# Patient Record
Sex: Female | Born: 1970
Health system: Southern US, Community
[De-identification: ages and names within clinical notes are randomized; demographics above are authoritative.]

## PROBLEM LIST (undated history)

## (undated) DIAGNOSIS — D649 Anemia, unspecified: Secondary | ICD-10-CM

## (undated) DIAGNOSIS — N200 Calculus of kidney: Secondary | ICD-10-CM

## (undated) DIAGNOSIS — M549 Dorsalgia, unspecified: Secondary | ICD-10-CM

## (undated) DIAGNOSIS — K921 Melena: Secondary | ICD-10-CM

## (undated) DIAGNOSIS — F419 Anxiety disorder, unspecified: Secondary | ICD-10-CM

## (undated) DIAGNOSIS — R55 Syncope and collapse: Secondary | ICD-10-CM

## (undated) DIAGNOSIS — N289 Disorder of kidney and ureter, unspecified: Secondary | ICD-10-CM

## (undated) DIAGNOSIS — F329 Major depressive disorder, single episode, unspecified: Secondary | ICD-10-CM

## (undated) DIAGNOSIS — K219 Gastro-esophageal reflux disease without esophagitis: Secondary | ICD-10-CM

## (undated) DIAGNOSIS — Z8619 Personal history of other infectious and parasitic diseases: Secondary | ICD-10-CM

## (undated) DIAGNOSIS — G8929 Other chronic pain: Secondary | ICD-10-CM

## (undated) DIAGNOSIS — R51 Headache: Secondary | ICD-10-CM

## (undated) DIAGNOSIS — L570 Actinic keratosis: Secondary | ICD-10-CM

## (undated) DIAGNOSIS — F32A Depression, unspecified: Secondary | ICD-10-CM

## (undated) HISTORY — PX: VARICOSE VEIN SURGERY: SHX832

## (undated) HISTORY — DX: Actinic keratosis: L57.0

## (undated) HISTORY — PX: DILATION AND CURETTAGE OF UTERUS: SHX78

## (undated) HISTORY — DX: Anemia, unspecified: D64.9

## (undated) HISTORY — DX: Depression, unspecified: F32.A

## (undated) HISTORY — DX: Syncope and collapse: R55

## (undated) HISTORY — DX: Calculus of kidney: N20.0

## (undated) HISTORY — DX: Personal history of other infectious and parasitic diseases: Z86.19

## (undated) HISTORY — DX: Melena: K92.1

## (undated) HISTORY — PX: ABDOMINAL HYSTERECTOMY: SHX81

## (undated) HISTORY — PX: OTHER SURGICAL HISTORY: SHX169

## (undated) HISTORY — PX: BACK SURGERY: SHX140

## (undated) HISTORY — DX: Headache: R51

## (undated) HISTORY — DX: Major depressive disorder, single episode, unspecified: F32.9

## (undated) HISTORY — PX: DIAGNOSTIC LAPAROSCOPY: SUR761

## (undated) HISTORY — PX: LUMBAR FUSION: SHX111

---

## 1997-10-25 ENCOUNTER — Ambulatory Visit (HOSPITAL_COMMUNITY): Admission: RE | Admit: 1997-10-25 | Discharge: 1997-10-25 | Payer: Self-pay | Admitting: Neurosurgery

## 1999-06-02 ENCOUNTER — Other Ambulatory Visit: Admission: RE | Admit: 1999-06-02 | Discharge: 1999-06-02 | Payer: Self-pay | Admitting: Obstetrics and Gynecology

## 1999-12-20 ENCOUNTER — Inpatient Hospital Stay (HOSPITAL_COMMUNITY): Admission: AD | Admit: 1999-12-20 | Discharge: 1999-12-23 | Payer: Self-pay | Admitting: Obstetrics and Gynecology

## 1999-12-27 ENCOUNTER — Observation Stay (HOSPITAL_COMMUNITY): Admission: AD | Admit: 1999-12-27 | Discharge: 1999-12-28 | Payer: Self-pay | Admitting: *Deleted

## 1999-12-30 ENCOUNTER — Encounter: Admission: RE | Admit: 1999-12-30 | Discharge: 2000-01-14 | Payer: Self-pay | Admitting: Obstetrics and Gynecology

## 2000-02-06 ENCOUNTER — Other Ambulatory Visit: Admission: RE | Admit: 2000-02-06 | Discharge: 2000-02-06 | Payer: Self-pay | Admitting: Obstetrics and Gynecology

## 2001-06-23 ENCOUNTER — Other Ambulatory Visit: Admission: RE | Admit: 2001-06-23 | Discharge: 2001-06-23 | Payer: Self-pay | Admitting: Obstetrics and Gynecology

## 2001-10-03 ENCOUNTER — Emergency Department (HOSPITAL_COMMUNITY): Admission: EM | Admit: 2001-10-03 | Discharge: 2001-10-03 | Payer: Self-pay | Admitting: *Deleted

## 2002-02-03 ENCOUNTER — Ambulatory Visit (HOSPITAL_COMMUNITY): Admission: RE | Admit: 2002-02-03 | Discharge: 2002-02-03 | Payer: Self-pay | Admitting: Obstetrics and Gynecology

## 2002-02-15 ENCOUNTER — Emergency Department (HOSPITAL_COMMUNITY): Admission: EM | Admit: 2002-02-15 | Discharge: 2002-02-16 | Payer: Self-pay | Admitting: Podiatry

## 2002-02-16 ENCOUNTER — Encounter: Payer: Self-pay | Admitting: *Deleted

## 2002-06-26 ENCOUNTER — Other Ambulatory Visit: Admission: RE | Admit: 2002-06-26 | Discharge: 2002-06-26 | Payer: Self-pay | Admitting: Obstetrics and Gynecology

## 2003-08-15 ENCOUNTER — Other Ambulatory Visit: Admission: RE | Admit: 2003-08-15 | Discharge: 2003-08-15 | Payer: Self-pay | Admitting: Obstetrics and Gynecology

## 2006-02-17 LAB — HIV ANTIBODY (ROUTINE TESTING W REFLEX): HIV: NONREACTIVE

## 2007-05-26 HISTORY — PX: KNEE SURGERY: SHX244

## 2007-05-26 HISTORY — PX: PATELLA REALIGNMENT: SHX2179

## 2007-08-08 ENCOUNTER — Encounter: Payer: Self-pay | Admitting: Physical Medicine & Rehabilitation

## 2007-08-24 ENCOUNTER — Encounter: Payer: Self-pay | Admitting: Physical Medicine & Rehabilitation

## 2007-09-23 ENCOUNTER — Encounter: Payer: Self-pay | Admitting: Physical Medicine & Rehabilitation

## 2007-10-24 ENCOUNTER — Encounter: Payer: Self-pay | Admitting: Physical Medicine & Rehabilitation

## 2009-04-03 ENCOUNTER — Emergency Department: Payer: Self-pay | Admitting: Emergency Medicine

## 2010-07-17 ENCOUNTER — Ambulatory Visit: Payer: Self-pay | Admitting: Family Medicine

## 2010-07-23 ENCOUNTER — Ambulatory Visit: Payer: Self-pay | Admitting: Family Medicine

## 2010-09-17 ENCOUNTER — Ambulatory Visit: Payer: Self-pay | Admitting: Family Medicine

## 2010-10-10 ENCOUNTER — Ambulatory Visit (INDEPENDENT_AMBULATORY_CARE_PROVIDER_SITE_OTHER): Payer: 59 | Admitting: Family Medicine

## 2010-10-10 ENCOUNTER — Encounter: Payer: Self-pay | Admitting: Family Medicine

## 2010-10-10 DIAGNOSIS — R079 Chest pain, unspecified: Secondary | ICD-10-CM

## 2010-10-10 DIAGNOSIS — G43909 Migraine, unspecified, not intractable, without status migrainosus: Secondary | ICD-10-CM

## 2010-10-10 DIAGNOSIS — F329 Major depressive disorder, single episode, unspecified: Secondary | ICD-10-CM

## 2010-10-10 DIAGNOSIS — K625 Hemorrhage of anus and rectum: Secondary | ICD-10-CM | POA: Insufficient documentation

## 2010-10-10 DIAGNOSIS — N809 Endometriosis, unspecified: Secondary | ICD-10-CM | POA: Insufficient documentation

## 2010-10-10 MED ORDER — OMEPRAZOLE 20 MG PO CPDR: 20.0000 mg | DELAYED_RELEASE_CAPSULE | Freq: Every day | ORAL | Status: DC

## 2010-10-10 MED ORDER — IBUPROFEN 800 MG PO TABS: ORAL_TABLET | ORAL | Status: DC

## 2010-10-10 NOTE — Progress Notes (Signed)
Subjective:    Patient ID: [name] Orr, female    DOB: 18-Apr-1971, 40 y.o.   MRN: [id number]  HPI Here to get est as new patient  Gyn-- Dr Rosemary Holms    Has complex med hx   Anemia - was hospitalized in teens -- for severe anemia  This followed a very bad period   Endometriosis- sees Dr Rosemary Holms -- extremely painful and heavy peroid  Was on OC -- (depo provera) - up until a year ago  Had laparoscopy - then preg and then depo  Now insurance- will likely go back on it  Concerned about wt gain with that -- tried "diet 2 mo " with a little exercise  Severe pain from endometriosis not just during period -- takes percocet from Dr Rosemary Holms for that =- infrequently  She had retained placenta after birth of her baby Almost died   Is single  Has 35 year old girl - 5th grade - will go to middle school    Depression  This happened after leaving boyfriend of 58 years (father of her daughter)  Overall better Saw psychiatrist briefly - and xanax  Better now   Migraine - was treated a long time ago j(? Dr Meryl Crutch) at [facility] center Still gets some occasionally - just deals with it   Father -- alcoholic and many health problems Parents live close by  Whole family comes here     Having trouble with cp-/ sob  Has had chest pains for a long time- never made an issue of it  Started in march Happened after she had a cold she could not get over -- went to urgent care and dx with costochondritis  Continues to have cp and sob  Not related to stress or exertion or eating  Can be seconds to 15 minutes  Center of her chest - a pressure sensation like someone standing on her  Gets sob with it too  Will sometimes get a headache with it -- because of getting worked up  Some nausea - no sweating no vomiting  Has not had EKG   Saw Dr Daleen Squibb remotely -- when she had palpitations with anxiety -- had echo / holter  6-7 years ago  All was normal   Not a caffiene drinker     Rectal bleeding   For the last year --has had bleeding every time she has a bm  Will drip out of her - bright red -  Sometimes has rectal pain  Did have hemorroids when she had her baby  Feels like she might have some swelling  Usually has bm every 3 days  No abd pain outside of her peroid -- very painful bms during her peroid   Non smoker  Non drinker   Past Medical History  Diagnosis Date  . Anemia   . Depression   . History of chicken pox   . Blood in stool     bright red each time has BM for atleast 6 mths  . Headache   . Fainting spell   . Migraine   . Endometriosis     Past Surgical History  Procedure Date  . Back surgery [phone]    Dr Trey Sailors  . Laproscopy   . Knee surgery 2009    lateral realignment to lt knee    History   Social History  . Marital Status: Single    Spouse Name: N/A    Number of Children: N/A  . Years of  Education: N/A   Occupational History  . Not on file.   Social History Main Topics  . Smoking status: Never Smoker   . Smokeless tobacco: Not on file  . Alcohol Use: Yes     occasionally  . Drug Use: No  . Sexually Active: Not on file   Other Topics Concern  . Not on file   Social History Narrative  . No narrative on file    Family History  Problem Relation Age of Onset  . Hyperlipidemia Mother   . Hypertension Mother   . Alcohol abuse Father   . Heart disease Father   . Stroke Father   . Kidney disease Father     Allergies  Allergen Reactions  . Penicillins Hives   .         Review of Systems Review of Systems  Constitutional: Negative for fever, appetite change, fatigue and unexpected weight change.  Eyes: Negative for pain and visual disturbance.  Respiratory: Negative for cough and shortness of breath.   Cardiovascular: Negative for edema/ leg pain, pos for chest pain .   Gastrointestinal: Negative , diarrhea and constipation. pos for nausea occas/ no vomiting , pos for blood in stool Genitourinary: Negative for  urgency and frequency.  Skin: Negative for pallor.  Neurological: Negative for weakness, light-headedness, numbness and headaches.  Hematological: Negative for adenopathy. Does not bruise/bleed easily.  Psychiatric/Behavioral: Negative for dysphoric mood. The patient is not nervous/anxious.          Objective:   Physical Exam  Constitutional: She appears well-developed and well-nourished. No distress.       overwt and well appearing   HENT:  Head: Normocephalic and atraumatic.  Right Ear: External ear normal.  Left Ear: External ear normal.  Mouth/Throat: Oropharynx is clear and moist.  Eyes: Conjunctivae and EOM are normal. Pupils are equal, round, and reactive to light.       No conj pallor  Neck: Normal range of motion. Neck supple. No JVD present. Carotid bruit is not present. No thyromegaly present.  Cardiovascular: Normal rate, regular rhythm and normal heart sounds.   Pulmonary/Chest: Effort normal and breath sounds normal. No respiratory distress. She has no wheezes. She exhibits no tenderness.  Abdominal: Soft. Normal appearance and bowel sounds are normal. She exhibits no abdominal bruit, no pulsatile midline mass and no mass. There is no hepatosplenomegaly. There is tenderness in the suprapubic area, left upper quadrant and left lower quadrant. There is negative Murphy's sign.  Musculoskeletal: She exhibits no edema and no tenderness.  Lymphadenopathy:    She has no cervical adenopathy.  Neurological: She is alert. She has normal reflexes. No cranial nerve deficit. Coordination normal.  Skin: Skin is warm and dry. No rash noted. No erythema. No pallor.  Psychiatric:       Seems mildly anx but pleasant  Good eye contact and comm skills           Assessment & Plan:

## 2010-10-10 NOTE — Patient Instructions (Signed)
Start omeprazole (generic prilosec)  20 mg each am  This is to see if chest pain is coming from reflux  Also watch out for spicy foods and acidic beverages  Also anti inflammatories -- like the ibuprofen  If you suddenly get worse -- go to ER  Follow up with me in about 2 weeks to continue disc the GI issues

## 2010-10-10 NOTE — Assessment & Plan Note (Signed)
Will disc this at f/u in 2 weeks  Cannot tolerate exam today in light of menses

## 2010-10-10 NOTE — Assessment & Plan Note (Signed)
This is atypical/ non exertional gerd or esoph spasm is in the differential  - esp given regular use of nsaids for endometriosis  Disc minimizing use of this Prior cardiol w/ u is re assuring - as is EKG today-- rate of 69 with sinus rhythm with occas PAC and no acute changes Trial of omeprazole and minimize nsaid (disc alt tx of endomet with gyn) F/u 2 wk If worse- stressed imp of going to ER

## 2010-10-12 DIAGNOSIS — G43509 Persistent migraine aura without cerebral infarction, not intractable, without status migrainosus: Secondary | ICD-10-CM | POA: Insufficient documentation

## 2010-10-12 DIAGNOSIS — F329 Major depressive disorder, single episode, unspecified: Secondary | ICD-10-CM | POA: Insufficient documentation

## 2010-10-12 NOTE — Assessment & Plan Note (Signed)
Continue f/u with Gyn Suspect she will need to be on OC to  Calm this down Per pt -hysterectomy is not out of the question

## 2010-10-23 ENCOUNTER — Other Ambulatory Visit: Payer: Self-pay | Admitting: Obstetrics and Gynecology

## 2010-10-23 LAB — HM PAP SMEAR: HM PAP: NEGATIVE

## 2010-10-29 ENCOUNTER — Ambulatory Visit: Payer: 59 | Admitting: Family Medicine

## 2010-12-13 ENCOUNTER — Emergency Department: Payer: Self-pay | Admitting: Emergency Medicine

## 2011-02-18 ENCOUNTER — Other Ambulatory Visit: Payer: Self-pay | Admitting: Obstetrics and Gynecology

## 2011-02-24 NOTE — Patient Instructions (Addendum)
   Your procedure is scheduled on:  Thursday, Oct. 11, 2012  Enter through the [employer] [facility] at: 6:00am Pick up the phone at the desk and dial [phone] and inform us of your arrival  Please call this number if you have any problems the morning of surgery: [phone]  Remember: Do not eat food after midnight  Wednesday, Oct. 10th Do not drink clear liquids after:  Wednesday, Oct. 10th Take these medicines the morning of surgery with a SIP OF WATER:  PRILOSEC  Do not wear jewelry, make-up, or FINGER nail polish Do not wear lotions, powders, or perfumes.  You may wear deodorant. Do not shave 48 hours prior to surgery. Do not bring valuables to the hospital. Leave suitcase in the car. After Surgery it may be brought to your room. For patients being admitted to the hospital, checkout time is 11:00am the day of discharge.  Patients discharged on the day of surgery will not be allowed to drive home.   Name and phone number of your driver:  mother Sullivan Blasing [phone]    Remember to use your hibiclens as instructed.Please shower with 1/2 bottle the evening before your surgery and the other 1/2 bottle the morning of surgery.

## 2011-02-26 ENCOUNTER — Encounter (HOSPITAL_COMMUNITY)
Admission: RE | Admit: 2011-02-26 | Discharge: 2011-02-26 | Disposition: A | Discharge status: Home / Self Care | Payer: 59 | Source: Ambulatory Visit | Attending: Obstetrics and Gynecology | Admitting: Obstetrics and Gynecology

## 2011-02-26 ENCOUNTER — Encounter (HOSPITAL_COMMUNITY): Payer: Self-pay

## 2011-02-26 HISTORY — DX: Anxiety disorder, unspecified: F41.9

## 2011-02-26 HISTORY — DX: Other chronic pain: G89.29

## 2011-02-26 HISTORY — DX: Dorsalgia, unspecified: M54.9

## 2011-02-26 HISTORY — DX: Gastro-esophageal reflux disease without esophagitis: K21.9

## 2011-02-26 LAB — CBC
Hemoglobin: 12.5 g/dL (ref 12.0–15.0)
MCH: 29.8 pg (ref 26.0–34.0)
MCHC: 33.7 g/dL (ref 30.0–36.0)
RDW: 12.5 % (ref 11.5–15.5)

## 2011-02-26 LAB — SURGICAL PCR SCREEN: MRSA, PCR: NEGATIVE

## 2011-02-26 NOTE — Pre-Procedure Instructions (Signed)
Ok for patient to see anesthesia DOS.  Patient instructed to take prilosec morning of surgery with small sip of water.

## 2011-03-05 ENCOUNTER — Ambulatory Visit (HOSPITAL_COMMUNITY): Payer: 59 | Admitting: Anesthesiology

## 2011-03-05 ENCOUNTER — Encounter (HOSPITAL_COMMUNITY): Payer: Self-pay | Admitting: Obstetrics and Gynecology

## 2011-03-05 ENCOUNTER — Encounter (HOSPITAL_COMMUNITY)
Admission: RE | Disposition: A | Discharge status: Home / Self Care | Payer: Self-pay | Source: Ambulatory Visit | Attending: Obstetrics and Gynecology

## 2011-03-05 ENCOUNTER — Encounter (HOSPITAL_COMMUNITY): Payer: Self-pay | Admitting: Anesthesiology

## 2011-03-05 ENCOUNTER — Other Ambulatory Visit: Payer: Self-pay | Admitting: Obstetrics and Gynecology

## 2011-03-05 ENCOUNTER — Ambulatory Visit (HOSPITAL_COMMUNITY)
Admission: RE | Admit: 2011-03-05 | Discharge: 2011-03-06 | Disposition: A | Discharge status: Home / Self Care | Payer: 59 | Source: Ambulatory Visit | Attending: Obstetrics and Gynecology | Admitting: Obstetrics and Gynecology

## 2011-03-05 DIAGNOSIS — N803 Endometriosis of pelvic peritoneum, unspecified: Secondary | ICD-10-CM | POA: Insufficient documentation

## 2011-03-05 DIAGNOSIS — N949 Unspecified condition associated with female genital organs and menstrual cycle: Secondary | ICD-10-CM | POA: Insufficient documentation

## 2011-03-05 DIAGNOSIS — Z01812 Encounter for preprocedural laboratory examination: Secondary | ICD-10-CM | POA: Insufficient documentation

## 2011-03-05 DIAGNOSIS — N946 Dysmenorrhea, unspecified: Secondary | ICD-10-CM | POA: Insufficient documentation

## 2011-03-05 DIAGNOSIS — Z01818 Encounter for other preprocedural examination: Secondary | ICD-10-CM | POA: Insufficient documentation

## 2011-03-05 SURGERY — ROBOTIC ASSISTED TOTAL HYSTERECTOMY
Anesthesia: General | Site: Uterus | Wound class: Clean Contaminated

## 2011-03-05 MED ORDER — BUPIVACAINE HCL (PF) 0.25 % IJ SOLN
INTRAMUSCULAR | Status: DC | PRN
  Administered 2011-03-05: 12 mL

## 2011-03-05 MED ORDER — GLYCOPYRROLATE 0.2 MG/ML IJ SOLN
INTRAMUSCULAR | Status: DC | PRN
  Administered 2011-03-05: .6 mg via INTRAVENOUS

## 2011-03-05 MED ORDER — ALUM & MAG HYDROXIDE-SIMETH 200-200-20 MG/5ML PO SUSP: 30.0000 mL | ORAL | Status: DC | PRN

## 2011-03-05 MED ORDER — ACETAMINOPHEN 10 MG/ML IV SOLN
1000.0000 mg | Freq: Four times a day (QID) | INTRAVENOUS | Status: DC
  Filled 2011-03-05 (×4): qty 100

## 2011-03-05 MED ORDER — HYDROMORPHONE 0.3 MG/ML IV SOLN
INTRAVENOUS | Status: AC
  Administered 2011-03-05: 11 mg via INTRAVENOUS
  Administered 2011-03-05: 12:00:00 via INTRAVENOUS

## 2011-03-05 MED ORDER — MAGNESIUM CITRATE PO SOLN
296.0000 mL | Freq: Every day | ORAL | Status: DC | PRN
  Filled 2011-03-05: qty 296

## 2011-03-05 MED ORDER — CIPROFLOXACIN IN D5W 400 MG/200ML IV SOLN
400.0000 mg | Freq: Two times a day (BID) | INTRAVENOUS | Status: AC
  Administered 2011-03-05: 400 mg via INTRAVENOUS
  Filled 2011-03-05: qty 200

## 2011-03-05 MED ORDER — CLINDAMYCIN PHOSPHATE 900 MG/50ML IV SOLN
900.0000 mg | Freq: Four times a day (QID) | INTRAVENOUS | Status: DC
  Administered 2011-03-05: 900 mg via INTRAVENOUS
  Filled 2011-03-05 (×6): qty 50

## 2011-03-05 MED ORDER — ROCURONIUM BROMIDE 100 MG/10ML IV SOLN
INTRAVENOUS | Status: DC | PRN
  Administered 2011-03-05: 50 mg via INTRAVENOUS
  Administered 2011-03-05: 10 mg via INTRAVENOUS

## 2011-03-05 MED ORDER — DIPHENHYDRAMINE HCL 12.5 MG/5ML PO ELIX
12.5000 mg | ORAL_SOLUTION | Freq: Four times a day (QID) | ORAL | Status: DC | PRN
  Filled 2011-03-05: qty 5

## 2011-03-05 MED ORDER — FENTANYL CITRATE 0.05 MG/ML IJ SOLN
INTRAMUSCULAR | Status: AC
  Filled 2011-03-05: qty 2

## 2011-03-05 MED ORDER — FENTANYL CITRATE 0.05 MG/ML IJ SOLN
INTRAMUSCULAR | Status: AC
  Administered 2011-03-05: 50 ug via INTRAVENOUS
  Filled 2011-03-05: qty 2

## 2011-03-05 MED ORDER — SIMETHICONE 80 MG PO CHEW: 80.0000 mg | CHEWABLE_TABLET | Freq: Four times a day (QID) | ORAL | Status: DC | PRN

## 2011-03-05 MED ORDER — NEOSTIGMINE METHYLSULFATE 1 MG/ML IJ SOLN
INTRAMUSCULAR | Status: AC
  Filled 2011-03-05: qty 10

## 2011-03-05 MED ORDER — CEFAZOLIN SODIUM 1-5 GM-% IV SOLN
INTRAVENOUS | Status: AC
  Filled 2011-03-05: qty 50

## 2011-03-05 MED ORDER — BISACODYL 5 MG PO TBEC
5.0000 mg | DELAYED_RELEASE_TABLET | Freq: Every day | ORAL | Status: DC | PRN
  Filled 2011-03-05: qty 1

## 2011-03-05 MED ORDER — ONDANSETRON HCL 4 MG/2ML IJ SOLN
INTRAMUSCULAR | Status: DC | PRN
  Administered 2011-03-05: 4 mg via INTRAVENOUS

## 2011-03-05 MED ORDER — LIDOCAINE HCL (CARDIAC) 20 MG/ML IV SOLN
INTRAVENOUS | Status: DC | PRN
  Administered 2011-03-05: 40 mg via INTRAVENOUS

## 2011-03-05 MED ORDER — SENNOSIDES-DOCUSATE SODIUM 8.6-50 MG PO TABS: 2.0000 | ORAL_TABLET | Freq: Every day | ORAL | Status: DC | PRN

## 2011-03-05 MED ORDER — PROPOFOL 10 MG/ML IV EMUL
INTRAVENOUS | Status: AC
  Filled 2011-03-05: qty 40

## 2011-03-05 MED ORDER — DIPHENHYDRAMINE HCL 50 MG/ML IJ SOLN
12.5000 mg | Freq: Four times a day (QID) | INTRAMUSCULAR | Status: DC | PRN
  Administered 2011-03-05 – 2011-03-06 (×3): 12.5 mg via INTRAVENOUS
  Filled 2011-03-05 (×2): qty 1

## 2011-03-05 MED ORDER — LACTATED RINGERS IV SOLN
INTRAVENOUS | Status: DC
  Administered 2011-03-05 (×2): via INTRAVENOUS

## 2011-03-05 MED ORDER — FENTANYL CITRATE 0.05 MG/ML IJ SOLN
25.0000 ug | INTRAMUSCULAR | Status: DC | PRN
  Administered 2011-03-05 (×3): 50 ug via INTRAVENOUS

## 2011-03-05 MED ORDER — ROCURONIUM BROMIDE 50 MG/5ML IV SOLN
INTRAVENOUS | Status: AC
  Filled 2011-03-05: qty 1

## 2011-03-05 MED ORDER — SUFENTANIL CITRATE 50 MCG/ML IV SOLN
INTRAVENOUS | Status: AC
  Filled 2011-03-05: qty 1

## 2011-03-05 MED ORDER — HYDROMORPHONE 0.3 MG/ML IV SOLN
INTRAVENOUS | Status: AC
  Filled 2011-03-05: qty 25

## 2011-03-05 MED ORDER — DEXAMETHASONE SODIUM PHOSPHATE 10 MG/ML IJ SOLN
INTRAMUSCULAR | Status: DC | PRN
  Administered 2011-03-05: 10 mg via INTRAVENOUS

## 2011-03-05 MED ORDER — KETOROLAC TROMETHAMINE 30 MG/ML IJ SOLN
30.0000 mg | Freq: Four times a day (QID) | INTRAMUSCULAR | Status: DC | PRN
  Administered 2011-03-05 (×2): 30 mg via INTRAVENOUS
  Filled 2011-03-05 (×2): qty 1

## 2011-03-05 MED ORDER — PROPOFOL 10 MG/ML IV EMUL
INTRAVENOUS | Status: DC | PRN
  Administered 2011-03-05 (×2): 30 mg via INTRAVENOUS
  Administered 2011-03-05: 20 mg via INTRAVENOUS
  Administered 2011-03-05: 150 mg via INTRAVENOUS
  Administered 2011-03-05: 20 mg via INTRAVENOUS

## 2011-03-05 MED ORDER — CEFAZOLIN SODIUM 1-5 GM-% IV SOLN: 1.0000 g | INTRAVENOUS | Status: DC

## 2011-03-05 MED ORDER — SUFENTANIL CITRATE 50 MCG/ML IV SOLN
INTRAVENOUS | Status: DC | PRN
  Administered 2011-03-05: 20 ug via INTRAVENOUS
  Administered 2011-03-05: 10 ug via INTRAVENOUS
  Administered 2011-03-05: 20 ug via INTRAVENOUS

## 2011-03-05 MED ORDER — DEXAMETHASONE SODIUM PHOSPHATE 10 MG/ML IJ SOLN
INTRAMUSCULAR | Status: AC
  Filled 2011-03-05: qty 1

## 2011-03-05 MED ORDER — GLYCOPYRROLATE 0.2 MG/ML IJ SOLN
INTRAMUSCULAR | Status: AC
  Filled 2011-03-05: qty 2

## 2011-03-05 MED ORDER — ACETAMINOPHEN 10 MG/ML IV SOLN
1000.0000 mg | Freq: Once | INTRAVENOUS | Status: AC
  Administered 2011-03-05: 1000 mg via INTRAVENOUS
  Filled 2011-03-05: qty 100

## 2011-03-05 MED ORDER — DOCUSATE SODIUM 100 MG PO CAPS: 100.0000 mg | ORAL_CAPSULE | Freq: Every day | ORAL | Status: DC

## 2011-03-05 MED ORDER — LIDOCAINE HCL (CARDIAC) 20 MG/ML IV SOLN
INTRAVENOUS | Status: AC
  Filled 2011-03-05: qty 5

## 2011-03-05 MED ORDER — ZOLPIDEM TARTRATE 5 MG PO TABS: 5.0000 mg | ORAL_TABLET | Freq: Every evening | ORAL | Status: DC | PRN

## 2011-03-05 MED ORDER — NEOSTIGMINE METHYLSULFATE 1 MG/ML IJ SOLN
INTRAMUSCULAR | Status: DC | PRN
  Administered 2011-03-05: 3 mg via INTRAVENOUS

## 2011-03-05 MED ORDER — MAGNESIUM HYDROXIDE 400 MG/5ML PO SUSP: 30.0000 mL | Freq: Every day | ORAL | Status: DC | PRN

## 2011-03-05 MED ORDER — KETOROLAC TROMETHAMINE 30 MG/ML IJ SOLN
INTRAMUSCULAR | Status: AC
  Administered 2011-03-05: 30 mg via INTRAVENOUS
  Filled 2011-03-05: qty 1

## 2011-03-05 MED ORDER — MIDAZOLAM HCL 2 MG/2ML IJ SOLN
INTRAMUSCULAR | Status: AC
  Filled 2011-03-05: qty 2

## 2011-03-05 MED ORDER — MIDAZOLAM HCL 5 MG/5ML IJ SOLN
INTRAMUSCULAR | Status: DC | PRN
  Administered 2011-03-05: 2 mg via INTRAVENOUS

## 2011-03-05 MED ORDER — BISACODYL 10 MG RE SUPP: 10.0000 mg | Freq: Every day | RECTAL | Status: DC | PRN

## 2011-03-05 MED ORDER — TRAMADOL HCL 50 MG PO TABS
50.0000 mg | ORAL_TABLET | Freq: Four times a day (QID) | ORAL | Status: DC | PRN
  Filled 2011-03-05: qty 1

## 2011-03-05 MED ORDER — DIPHENHYDRAMINE HCL 50 MG/ML IJ SOLN
INTRAMUSCULAR | Status: AC
  Administered 2011-03-06: 12.5 mg via INTRAVENOUS
  Filled 2011-03-05: qty 1

## 2011-03-05 MED ORDER — NALOXONE HCL 0.4 MG/ML IJ SOLN: 0.4000 mg | INTRAMUSCULAR | Status: DC | PRN

## 2011-03-05 MED ORDER — ONDANSETRON HCL 4 MG/2ML IJ SOLN
4.0000 mg | Freq: Four times a day (QID) | INTRAMUSCULAR | Status: DC | PRN
  Administered 2011-03-05: 4 mg via INTRAVENOUS
  Filled 2011-03-05: qty 2

## 2011-03-05 MED ORDER — KETOROLAC TROMETHAMINE 30 MG/ML IJ SOLN
30.0000 mg | Freq: Once | INTRAMUSCULAR | Status: AC
  Administered 2011-03-05: 30 mg via INTRAVENOUS

## 2011-03-05 MED ORDER — OXYCODONE-ACETAMINOPHEN 5-325 MG PO TABS
1.0000 | ORAL_TABLET | ORAL | Status: DC | PRN
  Administered 2011-03-06: 2 via ORAL
  Filled 2011-03-05: qty 2

## 2011-03-05 MED ORDER — LACTATED RINGERS IV SOLN
INTRAVENOUS | Status: DC
  Administered 2011-03-05 (×3): via INTRAVENOUS

## 2011-03-05 MED ORDER — ONDANSETRON HCL 4 MG/2ML IJ SOLN
INTRAMUSCULAR | Status: AC
  Filled 2011-03-05: qty 2

## 2011-03-05 MED ORDER — SODIUM CHLORIDE 0.9 % IJ SOLN: 9.0000 mL | INTRAMUSCULAR | Status: DC | PRN

## 2011-03-05 SURGICAL SUPPLY — 48 items
BAG URINE DRAINAGE (UROLOGICAL SUPPLIES) ×2 IMPLANT
BARRIER ADHS 3X4 INTERCEED (GAUZE/BANDAGES/DRESSINGS) ×2 IMPLANT
BLADELESS LONG 8MM (BLADE) ×2 IMPLANT
CABLE HIGH FREQUENCY MONO STRZ (ELECTRODE) ×2 IMPLANT
CATH FOLEY 3WAY  5CC 16FR (CATHETERS) ×1
CATH FOLEY 3WAY 5CC 16FR (CATHETERS) ×1 IMPLANT
CLOTH BEACON ORANGE TIMEOUT ST (SAFETY) ×2 IMPLANT
CONT PATH 16OZ SNAP LID 3702 (MISCELLANEOUS) ×2 IMPLANT
COVER MAYO STAND STRL (DRAPES) ×2 IMPLANT
COVER TABLE BACK 60X90 (DRAPES) ×4 IMPLANT
COVER TIP SHEARS 8 DVNC (MISCELLANEOUS) ×1 IMPLANT
COVER TIP SHEARS 8MM DA VINCI (MISCELLANEOUS) ×1
DECANTER SPIKE VIAL GLASS SM (MISCELLANEOUS) ×2 IMPLANT
DERMABOND ADVANCED (GAUZE/BANDAGES/DRESSINGS) ×2
DERMABOND ADVANCED .7 DNX12 (GAUZE/BANDAGES/DRESSINGS) ×2 IMPLANT
DRAPE HUG U DISPOSABLE (DRAPE) ×2 IMPLANT
DRAPE HYSTEROSCOPY (DRAPE) ×2 IMPLANT
DRAPE LG THREE QUARTER DISP (DRAPES) ×4 IMPLANT
DRAPE MONITOR DA VINCI (DRAPE) ×2 IMPLANT
DRAPE WARM FLUID 44X44 (DRAPE) ×2 IMPLANT
ELECT REM PT RETURN 9FT ADLT (ELECTROSURGICAL) ×2
ELECTRODE REM PT RTRN 9FT ADLT (ELECTROSURGICAL) ×1 IMPLANT
EVACUATOR SMOKE 8.L (FILTER) ×2 IMPLANT
GAUZE VASELINE 3X9 (GAUZE/BANDAGES/DRESSINGS) IMPLANT
GLOVE BIO SURGEON STRL SZ7.5 (GLOVE) ×8 IMPLANT
GOWN PREVENTION PLUS LG XLONG (DISPOSABLE) ×6 IMPLANT
GOWN PREVENTION PLUS XLARGE (GOWN DISPOSABLE) ×2 IMPLANT
KIT DISP ACCESSORY 4 ARM (KITS) ×2 IMPLANT
NEEDLE INSUFFLATION 14GA 120MM (NEEDLE) ×2 IMPLANT
NS IRRIG 1000ML POUR BTL (IV SOLUTION) ×6 IMPLANT
PACK LAVH (CUSTOM PROCEDURE TRAY) ×2 IMPLANT
PAD PREP 24X48 CUFFED NSTRL (MISCELLANEOUS) ×4 IMPLANT
PLUG CATH AND CAP STER (CATHETERS) ×2 IMPLANT
POSITIONER SURGICAL ARM (MISCELLANEOUS) ×4 IMPLANT
RUMI II 3.0CM BLUE KOH-EFFICIE (DISPOSABLE) ×2 IMPLANT
SET IRRIG TUBING LAPAROSCOPIC (IRRIGATION / IRRIGATOR) ×2 IMPLANT
SOLUTION ELECTROLUBE (MISCELLANEOUS) ×2 IMPLANT
SUT VICRYL 0 UR6 27IN ABS (SUTURE) ×4 IMPLANT
SUT VICRYL RAPIDE 4/0 PS 2 (SUTURE) ×4 IMPLANT
SYR 50ML LL SCALE MARK (SYRINGE) ×2 IMPLANT
SYRINGE 10CC LL (SYRINGE) ×2 IMPLANT
TIP UTERINE 6.7X10CM GRN DISP (MISCELLANEOUS) ×2 IMPLANT
TOWEL OR 17X24 6PK STRL BLUE (TOWEL DISPOSABLE) ×6 IMPLANT
TROCAR DISP BLADELESS 8 DVNC (TROCAR) ×1 IMPLANT
TROCAR DISP BLADELESS 8MM (TROCAR) ×1
TROCAR XCEL 12X100 BLDLESS (ENDOMECHANICALS) ×2 IMPLANT
TUBING FILTER THERMOFLATOR (ELECTROSURGICAL) ×2 IMPLANT
WARMER LAPAROSCOPE (MISCELLANEOUS) ×2 IMPLANT

## 2011-03-05 NOTE — Op Note (Signed)
03/05/2011  9:46 AM  PATIENT:  Gilford Raid  40 y.o. female  PRE-OPERATIVE DIAGNOSIS:  Pelvic Pain, Dysmenorrhea, Endometriosis  POST-OPERATIVE DIAGNOSIS:  Pelvic Pain, Dysmenorrhea, Endometriosis  PROCEDURE:  Procedure(s): ROBOTIC ASSISTED TOTAL HYSTERECTOMY Ablation of endometriosis Lysis of Adhesions  SURGEON:  Surgeon(s): Lenoard Aden, MD Serita Kyle, MD  PHYSICIAN ASSISTANT: none  ASSISTANTS: Dr. Cherly Hensen   ANESTHESIA:   local and general  ESTIMATED BLOOD LOSS: * No blood loss amount entered *   BLOOD ADMINISTERED:none  DRAINS: Urinary Catheter (Foley)   LOCAL MEDICATIONS USED:  MARCAINE 10CC  SPECIMEN:  Source of Specimen:  uterus and cervix  DISPOSITION OF SPECIMEN:  PATHOLOGY  COUNTS:  YES  TOURNIQUET:  * No tourniquets in log *  DICTATION #: [id number]  PLAN OF CARE: DC home today PATIENT DISPOSITION:  PACU - hemodynamically stable.

## 2011-03-05 NOTE — Transfer of Care (Signed)
Immediate Anesthesia Transfer of Care Note  Patient: [name] Orr  Procedure(s) Performed:  ROBOTIC ASSISTED TOTAL HYSTERECTOMY  Patient Location: PACU  Anesthesia Type: General  Level of Consciousness: alert , oriented and sedated  Airway & Oxygen Therapy: Patient Spontanous Breathing and Patient connected to nasal cannula oxygen  Post-op Assessment: Report given to PACU RN and Post -op Vital signs reviewed and stable  Post vital signs: stable  Complications: No apparent anesthesia complications

## 2011-03-05 NOTE — Progress Notes (Signed)
Encounter addended by: Karleen Dolphin on: 03/05/2011  5:52 PM<BR>     Documentation filed: Notes Section

## 2011-03-05 NOTE — Anesthesia Postprocedure Evaluation (Signed)
  Anesthesia Post-op Note  Patient: [name] Orr  Procedure(s) Performed:  ROBOTIC ASSISTED TOTAL HYSTERECTOMY  Patient Location: 317  Anesthesia Type: General  Level of Consciousness: awake, alert  and oriented  Airway and Oxygen Therapy: Patient Spontanous Breathing and Patient connected to nasal cannula oxygen  Post-op Pain: mild  Post-op Assessment: Post-op Vital signs reviewed and Patient's Cardiovascular Status Stable  Post-op Vital Signs: Reviewed and stable  Complications: No apparent anesthesia complications

## 2011-03-05 NOTE — Anesthesia Postprocedure Evaluation (Signed)
Anesthesia Post Note  Patient: [name] Orr  Procedure(s) Performed:  ROBOTIC ASSISTED TOTAL HYSTERECTOMY  Anesthesia type: General  Patient location: PACU  Post pain: Pain level controlled  Post assessment: Post-op Vital signs reviewed  Last Vitals:  Filed Vitals:   03/05/11 1115  BP: 102/57  Pulse: 84  Temp: 97.8 F (36.6 C)  Resp: 16    Post vital signs: Reviewed  Level of consciousness: sedated  Complications: No apparent anesthesia complications

## 2011-03-05 NOTE — Anesthesia Preprocedure Evaluation (Signed)
Anesthesia Evaluation  Name, MR# and DOB Patient awake  General Assessment Comment  Reviewed: Allergy & Precautions, H&P , NPO status , Patient's Chart, lab work & pertinent test results  Airway Mallampati: I TM Distance: >3 FB Neck ROM: full    Dental No notable dental hx. (+) Teeth Intact   Pulmonary  clear to auscultation  Pulmonary exam normal       Cardiovascular     Neuro/Psych  Headaches, PSYCHIATRIC DISORDERS Anxiety Depression    GI/Hepatic Neg liver ROS  GERD   Endo/Other  Negative Endocrine ROS  Renal/GU negative Renal ROS  Genitourinary negative   Musculoskeletal negative musculoskeletal ROS (+)   Abdominal Normal abdominal exam  (+)   Peds negative pediatric ROS (+)  Hematology negative hematology ROS (+)   Anesthesia Other Findings   Reproductive/Obstetrics negative OB ROS                           Anesthesia Physical Anesthesia Plan  ASA: II  Anesthesia Plan: General   Post-op Pain Management:    Induction: Intravenous  Airway Management Planned: Oral ETT  Additional Equipment:   Intra-op Plan:   Post-operative Plan:   Informed Consent: I have reviewed the patients History and Physical, chart, labs and discussed the procedure including the risks, benefits and alternatives for the proposed anesthesia with the patient or authorized representative who has indicated his/her understanding and acceptance.   Dental Advisory Given  Plan Discussed with: CRNA  Anesthesia Plan Comments:         Anesthesia Quick Evaluation

## 2011-03-05 NOTE — H&P (Signed)
[name], [name] NO.:  [id number]  MEDICAL RECORD NO.:  [id number]  LOCATION:                                 FACILITY:  PHYSICIAN:  Lenoard Aden, M.D.DATE OF BIRTH:  Jun 14, 1970  DATE OF ADMISSION: DATE OF DISCHARGE:                             HISTORY & PHYSICAL   CHIEF COMPLAINT:  Dysmenorrhea, menorrhagia refractory to medical therapy for definitive therapy.  HISTORY OF PRESENT ILLNESS:  The patient is a 40 year old white female, G1, P1, with a history of laparoscopic ablation of endometriosis who presents now with therapy.  ALLERGIES:  PENICILLIN.  FAMILY HISTORY:  Breast cancer, heart disease.  SOCIAL HISTORY:  Noncontributory.  History of one vaginal delivery.  SURGICAL HISTORY:  Remarkable for knee surgery, laparoscopic ablation of endometriosis in 1998, and D and C in 2000.  She is a nonsmoker, nondrinker.  She denies domestic or physical violence.  MEDICATIONS:  Percocet as needed and omeprazole.  PHYSICAL EXAMINATION:  GENERAL:  She is a well-developed, well-nourished white female in no acute distress. HEENT:  Normal. NECK:  Supple.  Full range of motion. LUNGS:  Clear. HEART:  Regular rhythm. ABDOMEN:  Soft, nontender. PELVIC:  Uterus to be retroflexed, normal size, shape, and contour.  No adnexal masses. EXTREMITIES:  There are no cords. NEUROLOGIC:  Nonfocal. SKIN:  Intact.  IMPRESSION:  Symptomatic, dysmenorrhea, menorrhagia with history of endometriosis with this therapy.  The patient does not desire childbearing.  Plan is to proceed with da Vinci assisted total laparoscopic hysterectomy.  Risks of anesthesia, infection, bleeding, injury to abdominal organ, and need for repair was discussed, delayed versus immediate complications to include bowel and bladder injury noted.  The patient's LFT acknowledges wishes to proceed.     Lenoard Aden, M.D.     RJT/MEDQ  D:  03/04/2011  T:  03/05/2011  Job:   [id number]

## 2011-03-05 NOTE — Op Note (Signed)
NAMEDEVANEE, POMPLUN NO.:  [id number]  MEDICAL RECORD NO.:  [id number]  LOCATION:  [facility]                          FACILITY:  [facility]  PHYSICIAN:  Lenoard Aden, M.D.DATE OF BIRTH:  [birth year]  DATE OF PROCEDURE:  03/05/2011 DATE OF DISCHARGE:                              OPERATIVE REPORT   SURGEON:  Lenoard Aden, MD  DESCRIPTION OF PROCEDURE:  After being apprised of the risks of anesthesia, infection, bleeding, injury to abdominal organs, need for repair, delayed versus immediate complications to include bowel, bladder injury, possible need for repair, possible need for blood transfusions with inherent risks of hepatitis and HIV, postoperative risks of pneumonia, DVT, and pulmonary embolism, the patient was brought the operating room after consent was signed.  She was also apprised of the fact that pelvic pain may not be resolved post operation.  After being brought to the operating room, she was put under general anesthetic without complications.  Prepped and draped in usual sterile fashion. Feet were placed in Yellofin stirrups.  After achieving adequate anesthesia, a Rumi retractor and Foley catheter placed in standard fashion.  Exam under anesthesia revealing a mobile and retroflexed uterus and no adnexal masses.  At this time, the attention was turned to the abdominal portion of the procedure whereby dilute Marcaine solution was placed and infraumbilical incision was made.  Veress needle placed to a opening pressure of -2.  CO2 5 L insufflated without difficulty. Visualization reveals adhesions in the right lower quadrant in addition to normal ovaries and some diffuse endometriosis throughout the cul-de- sac.  At this time, 8-mm ports were made bilaterally and a 5-mm port in the left upper quadrant.  The robot was then docked and after establishing deep Trendelenburg procedure and instruments, PK forceps and scissors were placed into the robotic  arms.  A 5-mm port was placed as an assistant port.  All done under direct visualization.  At this time, attention was turned to the robotic portion of the procedure whereby endometriosis in the cul-de-sac was ablated without difficulty. The ureters were identified bilaterally.  The retroperitoneal space was entered posterior to the round ligament on the left.  The ureters were identified.  The left tubo-ovarian ligament was clamped and cut.  The ureter was then dissected in a caudad fashion to push it down and further into the pelvis through gentle blunt dissection.  At this time, the round ligament was then ligated and cauterized.  The bladder flap was developed and the left uterine vessel vessels were skeletonized and cauterized but not cut on the right side.  After lysis of adhesions was performed, the retroperitoneal space was entered at the level of the round ligament.  The tubo-ovarian ligament was lamped.  The retroperitoneal space was further dissected pushing the ureter down in a similar fashion after identifying it to be normally peristalsing.  At this time, the round ligament was cut and divided and the bladder flap was further developed anteriorly.  Skeletonization of the right uterine artery and vessels were performed.  These vessels were then cauterized and cut, cold.  The Rumi cup was identified in a circumferential fashion, and Endo Shears were used to create a colpotomy incision in a  circumferential fashion exposing the vaginal cuff.  The specimen was then retracted into the vagina and good hemostasis was achieved.  The cuff was then closed using a 0 V-Loc suture in a continuous running fashion.  Good hemostasis noted.  Irrigation was accomplished.  The robot was then undocked at this time and revisualization reveals hemostatic vaginal cuff.  Kleppinger bipolar cautery was used to cauterize some small bleeders along the bladder flap.  At this time, irrigation was further  accomplished.  All trocars were removed under direct visualization.  CO2 was released.  Trocar sites were closed using 0-Vicryl and 4-0 Vicryl and Dermabond.  The patient tolerated the procedure well and was awakened and transferred to recovery in the usual fashion.  Specimen was sent to pathology for permanent section.     Lenoard Aden, M.D.     RJT/MEDQ  D:  03/05/2011  T:  03/05/2011  Job:  [id number]

## 2011-03-05 NOTE — Progress Notes (Signed)
  H&P dictated on 03/04/11 No changes noted.

## 2011-03-05 NOTE — Progress Notes (Signed)
Day of Surgery Procedure(s): ROBOTIC ASSISTED TOTAL HYSTERECTOMY  Subjective: Patient reports incisional pain, tolerating PO and no problems voiding.    Objective: I have reviewed patient's vital signs and intake and output.  General: alert and cooperative Vaginal Bleeding: minimal  Assessment: s/p Procedure(s): ROBOTIC ASSISTED TOTAL HYSTERECTOMY: stable  Plan: Advance diet Encourage ambulation Advance to PO medication  LOS: 0 days    [name] Orr J 03/05/2011, 9:56 PM

## 2011-03-06 LAB — CBC
HCT: 30.6 % — ABNORMAL LOW (ref 36.0–46.0)
Hemoglobin: 10.3 g/dL — ABNORMAL LOW (ref 12.0–15.0)
RDW: 12.5 % (ref 11.5–15.5)
WBC: 9.4 10*3/uL (ref 4.0–10.5)

## 2011-03-06 MED ORDER — OXYCODONE-ACETAMINOPHEN 5-325 MG PO TABS: 1.0000 | ORAL_TABLET | ORAL | Status: AC | PRN

## 2011-03-06 NOTE — Progress Notes (Signed)
1 Day Post-Op Procedure(s): ROBOTIC ASSISTED TOTAL HYSTERECTOMY  Subjective: Patient reports incisional pain, tolerating PO, + flatus and no problems voiding.    Objective: I have reviewed patient's vital signs, intake and output, medications and labs.  General: alert, cooperative and appears stated age Resp: clear to auscultation bilaterally and normal percussion bilaterally Cardio: regular rate and rhythm, S1, S2 normal, no murmur, click, rub or gallop GI: soft, non-tender; bowel sounds normal; no masses,  no organomegaly and incision: clean, dry and intact Extremities: extremities normal, atraumatic, no cyanosis or edema, Homans sign is negative, no sign of DVT and no edema, redness or tenderness in the calves or thighs Vaginal Bleeding: minimal Neuro: nonfocal  Assessment: s/p Procedure(s): ROBOTIC ASSISTED TOTAL HYSTERECTOMY: stable, progressing well and tolerating diet  Plan: Advance diet Encourage ambulation Advance to PO medication Discontinue IV fluids Discharge home  LOS: 1 day    Jaideep Pollack J 03/06/2011, 7:57 AM

## 2011-06-09 ENCOUNTER — Ambulatory Visit (INDEPENDENT_AMBULATORY_CARE_PROVIDER_SITE_OTHER): Payer: 59 | Admitting: Family Medicine

## 2011-06-09 ENCOUNTER — Encounter: Payer: Self-pay | Admitting: Family Medicine

## 2011-06-09 ENCOUNTER — Ambulatory Visit: Payer: 59 | Admitting: Family Medicine

## 2011-06-09 VITALS — BP 106/78 | HR 80 | Temp 98.1°F | Ht 68.0 in | Wt 196.0 lb

## 2011-06-09 DIAGNOSIS — J069 Acute upper respiratory infection, unspecified: Secondary | ICD-10-CM

## 2011-06-09 MED ORDER — GUAIFENESIN-CODEINE 100-10 MG/5ML PO SYRP: 5.0000 mL | ORAL_SOLUTION | Freq: Every evening | ORAL | Status: AC | PRN

## 2011-06-09 MED ORDER — ALBUTEROL SULFATE HFA 108 (90 BASE) MCG/ACT IN AERS: 2.0000 | INHALATION_SPRAY | RESPIRATORY_TRACT | Status: DC | PRN

## 2011-06-09 NOTE — Progress Notes (Signed)
Subjective:    Patient ID: [name] Orr, female    DOB: [birth year], 41 y.o.   MRN: [id number]  HPI Started symptoms on Sunday - went to work yesterday / feeling blah  Started with st and runny nose and sneezing  Now ears feel full - but not painful Cough with green mucous  Wheezing -no inhaler  Has wheezed in past when she is sick   Had a headache last week and eyes were bloodshot  No fever   Patient Active Problem List  Diagnoses  . Chest pain  . Rectal bleeding  . Endometriosis  . Depression  . Migraine  . URI (upper respiratory infection)   Past Medical History  Diagnosis Date  . Depression   . History of chicken pox   . Blood in stool     bright red each time has BM for atleast 6 mths  . Headache   . Fainting spell   . Migraine   . Endometriosis   . Anemia     history  . GERD (gastroesophageal reflux disease)   . Chronic back pain greater than 3 months duration   . Anxiety     no meds   Past Surgical History  Procedure Date  . Back surgery [phone]    Dr Trey Sailors  . Laproscopy   . Knee surgery 2009    lateral realignment to lt knee  . Diagnostic laparoscopy     x 2  . Svd     x 1  . Dilation and curettage of uterus   . Colonscopy    History  Substance Use Topics  . Smoking status: Never Smoker   . Smokeless tobacco: Never Used  . Alcohol Use: Yes     3-4 glasses of wine per month   Family History  Problem Relation Age of Onset  . Hyperlipidemia Mother   . Hypertension Mother   . Alcohol abuse Father   . Heart disease Father   . Stroke Father   . Kidney disease Father    Allergies  Allergen Reactions  . Penicillins Hives   Current Outpatient Prescriptions on File Prior to Visit  Medication Sig Dispense Refill  . ibuprofen (ADVIL,MOTRIN) 800 MG tablet Prn menstrual cramps  30 tablet  0  . omeprazole (PRILOSEC) 20 MG capsule Take 1 capsule (20 mg total) by mouth daily.  30 capsule  5  . oxyCODONE-acetaminophen (PERCOCET)  5-325 MG per tablet Take 1 tablet by mouth every 6 (six) hours as needed. Maximum of 12 per day. Prescribed by GYN          Review of Systems Review of Systems  Constitutional: Negative for fever, appetite change, and unexpected weight change pos for fatigue .  Eyes: Negative for pain and visual disturbance.  ENT pos for nasal cong and st/ no ear pain or sinus pain  Respiratory: Negative for sob/ pos for wheeze and cough .   Cardiovascular: Negative for cp or palpitations    Gastrointestinal: Negative for nausea, diarrhea and constipation.  Genitourinary: Negative for urgency and frequency.  Skin: Negative for pallor or rash   Neurological: Negative for weakness, light-headedness, numbness and headaches.  Hematological: Negative for adenopathy. Does not bruise/bleed easily.  Psychiatric/Behavioral: Negative for dysphoric mood. The patient is not nervous/anxious.          Objective:   Physical Exam  Constitutional: She appears well-developed and well-nourished. No distress.  HENT:  Head: Normocephalic and atraumatic.  Right Ear: External ear normal.  Left Ear: External ear normal.  Mouth/Throat: Oropharynx is clear and moist. No oropharyngeal exudate.       Nares are injected and congested   Clear rhinorrhea No sinus tenderness Post nasal drip  Eyes: Right eye exhibits no discharge. Left eye exhibits no discharge.  Neck: Normal range of motion. Neck supple. No thyromegaly present.  Cardiovascular: Normal rate, regular rhythm and normal heart sounds.   Pulmonary/Chest: Effort normal and breath sounds normal. No respiratory distress. She has no wheezes. She has no rales. She exhibits no tenderness.       Harsh bs diffusely  No rales or rhonchi No wheeze/ even on forced exp  Lymphadenopathy:    She has no cervical adenopathy.  Skin: Skin is warm and dry. No rash noted. No erythema. No pallor.  Psychiatric: She has a normal mood and affect.          Assessment & Plan:

## 2011-06-09 NOTE — Assessment & Plan Note (Signed)
With congestion/ cough and intermittent wheeze Pro air and robitussin ac px Disc symptomatic care - see instructions on AVS  Pt inst to Update if not starting to improve in a week or if worsening

## 2011-06-09 NOTE — Patient Instructions (Addendum)
Drink lots of fluids and get rest  The mucinex and advil cold are ok  Try robitussin ac at night for more severe cough (skip mucinex at night when you take this)  It will make you sleepy  Use the proair inhaler for wheezing as needed Update if not starting to improve in a week or if worsening   Get a flu shot when you are feeling better

## 2011-08-24 ENCOUNTER — Ambulatory Visit (INDEPENDENT_AMBULATORY_CARE_PROVIDER_SITE_OTHER): Payer: 59 | Admitting: Family Medicine

## 2011-08-24 ENCOUNTER — Encounter: Payer: Self-pay | Admitting: Family Medicine

## 2011-08-24 VITALS — BP 110/70 | HR 76 | Temp 98.5°F | Wt 201.8 lb

## 2011-08-24 DIAGNOSIS — R0609 Other forms of dyspnea: Secondary | ICD-10-CM

## 2011-08-24 DIAGNOSIS — R059 Cough, unspecified: Secondary | ICD-10-CM | POA: Insufficient documentation

## 2011-08-24 DIAGNOSIS — R05 Cough: Secondary | ICD-10-CM | POA: Insufficient documentation

## 2011-08-24 DIAGNOSIS — R0689 Other abnormalities of breathing: Secondary | ICD-10-CM

## 2011-08-24 MED ORDER — IPRATROPIUM BROMIDE 0.02 % IN SOLN
0.5000 mg | Freq: Once | RESPIRATORY_TRACT | Status: AC
  Administered 2011-08-24: 0.5 mg via RESPIRATORY_TRACT

## 2011-08-24 MED ORDER — ALBUTEROL SULFATE (2.5 MG/3ML) 0.083% IN NEBU
2.5000 mg | INHALATION_SOLUTION | Freq: Once | RESPIRATORY_TRACT | Status: AC
  Administered 2011-08-24: 2.5 mg via RESPIRATORY_TRACT

## 2011-08-24 NOTE — Assessment & Plan Note (Addendum)
?  allergic asthma component due to spring allergens. Alb/atrovent neb today - subjective improvement in breathing. Start regular use of albuterol as well as mucinex and antihistamine. Update [place] if not improving as expected or red flags (fever, productive cough, etc)

## 2011-08-24 NOTE — Patient Instructions (Signed)
I wonder if you have reactive airways to spring allergens. Start ventolin scheduled 2 puffs every 6 hours for next 3-5 days then as needed. Start simple mucinex with plenty of fluid. Start antihistamine like zyrtec or claritin regularly. Watch for worsening productive cough, fever >101 or call us if other concerns.

## 2011-08-24 NOTE — Progress Notes (Signed)
  Subjective:    Patient ID: [name] Orr, female    DOB: 09/18/1970, 41 y.o.   MRN: [id number]  HPI CC: chest cough  2 night h/o chest congestion.  Wakes up with difficulty breathing.  Feeling wheezy and SOB.  More tired last few days.  Left work early today.  + coughing.  rattly cough, but unable to produce sputum.  Mild fever yesterday.  So far has tried ventolin which may have helped.  No h/o asthma.  No fevers/chills, ear pain or tooth pain, ST, nasal congestion or rhinorrhea, PNDrainage.    No sick contacts at home, no smokers at home.  No h/o asthma.  Does have h/o seasonal allergies but no upper resp sxs currently.  Review of Systems Per HPI    Objective:   Physical Exam  Nursing note and vitals reviewed. Constitutional: She appears well-developed and well-nourished. No distress.  HENT:  Head: Normocephalic and atraumatic.  Right Ear: Hearing, tympanic membrane, external ear and ear canal normal.  Left Ear: Hearing, tympanic membrane, external ear and ear canal normal.  Nose: Mucosal edema present. No rhinorrhea.  Mouth/Throat: Uvula is midline, oropharynx is clear and moist and mucous membranes are normal. No oropharyngeal exudate, posterior oropharyngeal edema, posterior oropharyngeal erythema or tonsillar abscesses.       L turbinate swelling No PNdrainage  Eyes: Conjunctivae and EOM are normal. Pupils are equal, round, and reactive to light. No scleral icterus.  Neck: Normal range of motion. Neck supple.  Cardiovascular: Normal rate, regular rhythm, normal heart sounds and intact distal pulses.   No murmur heard. Pulmonary/Chest: Effort normal and breath sounds normal. No respiratory distress. She has no wheezes. She has no rales. She exhibits no tenderness.       No wheezing appreciated but trouble exhaling fully  Musculoskeletal: She exhibits no edema.  Lymphadenopathy:    She has no cervical adenopathy.  Skin: Skin is warm and dry. No rash noted.    Psychiatric: She has a normal mood and affect.   After albuterol/atrovent neb - improved air movement, subjective improved breathing but makes her jittery.    Assessment & Plan:

## 2011-08-24 NOTE — Progress Notes (Signed)
Addended by: Sydell Axon C on: 08/24/2011 03:25 PM   Modules accepted: Orders

## 2011-08-25 ENCOUNTER — Telehealth: Payer: Self-pay | Admitting: Family Medicine

## 2011-08-25 MED ORDER — PREDNISONE 20 MG PO TABS: ORAL_TABLET | ORAL | Status: DC

## 2011-08-25 NOTE — Telephone Encounter (Signed)
Patient notified. Advised that steroid had been sent in to pharmacy. Discussed side effects of steroids with patient. She verbalized understanding. Instructed that if no improvement after 24 hours to call for appt. If worsening, instructed to go to [facility] or ER. Patient verbalized understanding.

## 2011-08-25 NOTE — Telephone Encounter (Signed)
New sxs now - productive cough of green mucous as well as clear sinus drainage.  sxs going on now 4 days.  When I saw her I thought more RAD component.  Anticipate viral process.  May add on oral steroid - plz discuss steroid precautions (restless, irritability, weight gain or appetite decrease).  If not better in 24 hours or any worsening, schedule OV with [place] again or to [facility] to be seen.

## 2011-08-25 NOTE — Telephone Encounter (Signed)
Caller: [place]/Patient; PCP: Roxy Manns A.; CB#: [phone];  Calling today 08/25/11 regarding saw Dr. Sharen Hones for chest congestion and difficulty breathing yesterday 08/24/11.  MD said thought was reactive airway related to allergies.  Was given a breathing treatment in office and was prescribed Albuterol inhaler and take Mucines and Claritin, and if got worse or did not improve to call back.  Pt calling back today b/c having clear sinus drainage and still having SOB.  Does not know if she is running fever, thermometer is not working. Still having coughing with wheezing.  Has coughed up green mucus. Inhaler has not helped.   Pt initially said felt like "elephant sitting on chest" advised pt to call 911, pt refused re-stating saying just feels "like a lot of congestion in my chest".  Onse of symptoms Saturday night.  Emergent symptoms r/o by Breathing Problems guidelines with exception of new or worsening breathing problems not responding to treatment or no treatment plan.  Care advice given. No appts available in EPIC for today.  Per practice note on profile, sending message to MD b/c pt was just seen yesterday.  PLEASE CALL PT BACK AT [phone] TO ADVISE IF SHE CAN BE WORKED IN FOR APPT OR IF SHE NEEDS TO GO TO URGENT CARE.

## 2011-08-27 ENCOUNTER — Ambulatory Visit (INDEPENDENT_AMBULATORY_CARE_PROVIDER_SITE_OTHER)
Admission: RE | Admit: 2011-08-27 | Discharge: 2011-08-27 | Disposition: A | Discharge status: Home / Self Care | Payer: 59 | Source: Ambulatory Visit | Attending: Family Medicine | Admitting: Family Medicine

## 2011-08-27 ENCOUNTER — Telehealth: Payer: Self-pay | Admitting: *Deleted

## 2011-08-27 ENCOUNTER — Ambulatory Visit (INDEPENDENT_AMBULATORY_CARE_PROVIDER_SITE_OTHER): Payer: 59 | Admitting: Family Medicine

## 2011-08-27 ENCOUNTER — Encounter: Payer: Self-pay | Admitting: Family Medicine

## 2011-08-27 VITALS — BP 124/98 | HR 120 | Temp 99.3°F | Wt 201.0 lb

## 2011-08-27 DIAGNOSIS — R05 Cough: Secondary | ICD-10-CM

## 2011-08-27 DIAGNOSIS — R079 Chest pain, unspecified: Secondary | ICD-10-CM

## 2011-08-27 MED ORDER — HYDROCOD POLST-CHLORPHEN POLST 10-8 MG/5ML PO LQCR: 5.0000 mL | Freq: Two times a day (BID) | ORAL | Status: DC | PRN

## 2011-08-27 MED ORDER — AZITHROMYCIN 250 MG PO TABS: ORAL_TABLET | ORAL | Status: DC

## 2011-08-27 MED ORDER — AZITHROMYCIN 250 MG PO TABS: ORAL_TABLET | ORAL | Status: AC

## 2011-08-27 NOTE — Patient Instructions (Addendum)
I think you've developed bronchitis- treat with zpack.  Sent to pharmacy. Push fluids, rest. Stop prednisone.  Then may take ibuprofen for pain.  I think this pain is coming from pleurisy Continue albuterol as needed. Use tussionex for cough as needed.  Pleurisy Pleurisy is an inflammation and swelling of the lining of the lungs. It usually is the result of an underlying infection or other disease. Because of this inflammation, it hurts to breathe. It is aggravated by coughing or deep breathing. The primary goal in treating pleurisy is to diagnose and treat the condition that caused it.  HOME CARE INSTRUCTIONS   Only take over-the-counter or prescription medicines for pain, discomfort, or fever as directed by your caregiver.   If medications which kill germs (antibiotics) were prescribed, take the entire course. Even if you are feeling better, you need to take them.   Use a cool mist vaporizer to help loosen secretions. This is so the secretions can be coughed up more easily.  SEEK MEDICAL CARE IF:   Your pain is not controlled with medication or is increasing.   You have an increase inpus like (purulent) secretions brought up with coughing.  SEEK IMMEDIATE MEDICAL CARE IF:   You have blue or dark lips, fingernails, or toenails.   You begin coughing up blood.   You have increased difficulty breathing.   You have continuing pain unrelieved by medicine or lasting more than 1 week.   You have pain that radiates into your neck, arms, or jaw.   You develop increased shortness of breath or wheezing.   You develop a fever, rash, vomiting, fainting, or other serious complaints.  Document Released: 05/11/2005 Document Revised: 04/30/2011 Document Reviewed: 12/10/2006 [facility] Patient Information 2012 [place], [place].  Bronchitis Bronchitis is the body's way of reacting to injury and/or infection (inflammation) of the bronchi. Bronchi are the air tubes that extend from the windpipe into  the lungs. If the inflammation becomes severe, it may cause shortness of breath. CAUSES  Inflammation may be caused by:  A virus.   Germs (bacteria).   Dust.   Allergens.   Pollutants and many other irritants.  The cells lining the bronchial tree are covered with tiny hairs (cilia). These constantly beat upward, away from the lungs, toward the mouth. This keeps the lungs free of pollutants. When these cells become too irritated and are unable to do their job, mucus begins to develop. This causes the characteristic cough of bronchitis. The cough clears the lungs when the cilia are unable to do their job. Without either of these protective mechanisms, the mucus would settle in the lungs. Then you would develop pneumonia. Smoking is a common cause of bronchitis and can contribute to pneumonia. Stopping this habit is the single most important thing you can do to help yourself. TREATMENT   Your caregiver may prescribe an antibiotic if the cough is caused by bacteria. Also, medicines that open up your airways make it easier to breathe. Your caregiver may also recommend or prescribe an expectorant. It will loosen the mucus to be coughed up. Only take over-the-counter or prescription medicines for pain, discomfort, or fever as directed by your caregiver.   Removing whatever causes the problem (smoking, for example) is critical to preventing the problem from getting worse.   Cough suppressants may be prescribed for relief of cough symptoms.   Inhaled medicines may be prescribed to help with symptoms now and to help prevent problems from returning.   For those with recurrent (  chronic) bronchitis, there may be a need for steroid medicines.  SEEK IMMEDIATE MEDICAL CARE IF:   During treatment, you develop more pus-like mucus (purulent sputum).   You have a fever.   Your baby is older than 3 months with a rectal temperature of 102 F (38.9 C) or higher.   Your baby is 26 months old or younger  with a rectal temperature of 100.4 F (38 C) or higher.   You become progressively more ill.   You have increased difficulty breathing, wheezing, or shortness of breath.  It is necessary to seek immediate medical care if you are elderly or sick from any other disease. MAKE SURE YOU:   Understand these instructions.   Will watch your condition.   Will get help right away if you are not doing well or get worse.  Document Released: 05/11/2005 Document Revised: 04/30/2011 Document Reviewed: 03/20/2008 [facility] Patient Information 2012 [place], [place].

## 2011-08-27 NOTE — Telephone Encounter (Signed)
Patient called and said she feels worse than she did when she came in. She has started the steroids and hasn't noticed any improvement. She is having some back pain now, she believes from coughing. She says she can feel congestion in her chest, but it won't come out. She has been taking mucinex with plenty of fluid with no relief. Temp this AM was 99.3. She is achy and going from feeling flushed to having chills. She hasn't been able to go back to work yet. Any other suggestions for her?

## 2011-08-27 NOTE — Telephone Encounter (Signed)
Patient notified and appt scheduled.

## 2011-08-27 NOTE — Telephone Encounter (Signed)
Needs to schedule OV with [place] or to [facility] to be re evaluated.  I could see her now if able to come in.

## 2011-08-27 NOTE — Progress Notes (Signed)
Addended by: Eustaquio Boyden on: 08/27/2011 01:26 PM   Modules accepted: Orders

## 2011-08-27 NOTE — Progress Notes (Signed)
  Subjective:    Patient ID: [name] Orr, female    DOB: 1970/07/31, 41 y.o.   MRN: [id number]  HPI CC: cough worsening  Seen 08/24/2011 with 2 night h/o chest congestion and worsening cough.  Thought RAD exacerbation to seasonal changes, treated with albuterol/atrovent neb in office (which improved subjective SOB) as well as sent home with albuterol.  Called back a few days later, worsening SOB, so added prednisone course.  Presents today with worsening cough.  Now hurting all over.  Chest hurting, ears hurting, back hurting.  Yesterday nose all stopped up, eyes watery.  Yesterday had bad pain in back, worse when standing.  Continued tightness worse with cough.  Rattly cough but unable to bring anything up.  Mucinex not helping.  cheratussin not helping.  Appetite down.    New corzya, R ear pain, sinus pressure, rhinorrhea, PNDrainage, bad HA.  Chest pain pleuritic.  Eyes are staying blood shot, watery.  No pruritis.  Subjective fevers, but none documented.  Has been taking meds as prescribed.  Using albuterol Q4-6 hours, not helping with SOB.  Started steroids 2d ago.    Not on hormonal meds.  No recent prolonged periods of immobility.  No personal or family hx blood clots.    Wt Readings from Last 3 Encounters:  08/27/11 201 lb (91.173 kg)  08/24/11 201 lb 12 oz (91.513 kg)  06/09/11 196 lb (88.905 kg)     Review of Systems Per HPI    Objective:   Physical Exam  Nursing note and vitals reviewed. Constitutional: She appears well-developed and well-nourished. No distress.  HENT:  Head: Normocephalic and atraumatic.  Right Ear: Hearing, tympanic membrane, external ear and ear canal normal.  Left Ear: Hearing, tympanic membrane, external ear and ear canal normal.  Nose: Mucosal edema present. No rhinorrhea. Right sinus exhibits frontal sinus tenderness. Left sinus exhibits frontal sinus tenderness.  Mouth/Throat: Uvula is midline, oropharynx is clear and moist and mucous  membranes are normal. No oropharyngeal exudate, posterior oropharyngeal edema, posterior oropharyngeal erythema or tonsillar abscesses.       continued turbinate swelling No PNdrainage  Eyes: EOM are normal. Pupils are equal, round, and reactive to light. Right conjunctiva is injected. Left conjunctiva is injected. No scleral icterus.  Neck: Normal range of motion. Neck supple.  Cardiovascular: Normal rate, regular rhythm, normal heart sounds and intact distal pulses.   No murmur heard. Pulmonary/Chest: Effort normal and breath sounds normal. No respiratory distress. She has no wheezes. She has no rales. She exhibits no tenderness.       No wheezing appreciated but pain with deep inhalation as well as coughing fits present  Musculoskeletal: She exhibits no edema.  Lymphadenopathy:    She has no cervical adenopathy.  Skin: Skin is warm and dry. No rash noted.  Psychiatric: She has a normal mood and affect.      Assessment & Plan:

## 2011-08-27 NOTE — Assessment & Plan Note (Addendum)
Worsening cough with new sxs of coryza, rhinorrhea, conjunctivitis. Anticipate worsening bronchitis. However given significant pleuritic pain, check CXR to r/o lung infection - clear on my read. As seems to be progressively worsening, cover for bacterial infection with zpack. If not better, return for further evaluation. Stop steroids as not really helping and no wheezing on exam today. No risk factors for blood clots.  No leg pain.  Good O2 sat.

## 2011-12-07 ENCOUNTER — Telehealth: Payer: Self-pay | Admitting: Family Medicine

## 2011-12-07 ENCOUNTER — Ambulatory Visit (INDEPENDENT_AMBULATORY_CARE_PROVIDER_SITE_OTHER): Payer: 59 | Admitting: Family Medicine

## 2011-12-07 ENCOUNTER — Encounter: Payer: Self-pay | Admitting: Family Medicine

## 2011-12-07 VITALS — BP 100/68 | HR 78 | Temp 98.2°F | Ht 68.0 in | Wt 198.8 lb

## 2011-12-07 DIAGNOSIS — G43509 Persistent migraine aura without cerebral infarction, not intractable, without status migrainosus: Secondary | ICD-10-CM

## 2011-12-07 MED ORDER — PROMETHAZINE HCL 25 MG PO TABS: 25.0000 mg | ORAL_TABLET | Freq: Three times a day (TID) | ORAL | Status: DC | PRN

## 2011-12-07 MED ORDER — SUMATRIPTAN SUCCINATE 100 MG PO TABS: ORAL_TABLET | ORAL | Status: DC

## 2011-12-07 NOTE — Telephone Encounter (Signed)
Caller: [name] Orr/Patient; PCP: Tower, Marne A.; CB#: [phone]; ; ; Call regarding Headache;  [name] Orr developed a headache on 12/05/11.  Associated with nausea and blurred vision.  Was of sudden onset.  Took Ibuprofen with some relief.  Continued to have headache on 12/06/11 but it was mild during the day.  Became more severe at night.  Headache returned today along with nausea.  Denies blurred vision or altered mental status today.  States that symptoms are not as bad today as they were on 12/05/11.  Just took 600mg  Ibuprofen. Temp 97.9 O.   Denies pregnancy.  Hysterectomy in October 2012. Utilized Headache Guideline.  See PCP within 4 hrs disposition.  States she already has appt for today at 1500 with Dr. Milinda Antis.  Parameters reviewed concerning when to call back.

## 2011-12-07 NOTE — Progress Notes (Signed)
Subjective:    Patient ID: [name] Orr, female    DOB: 07/29/1970, 41 y.o.   MRN: [id number]  HPI Here with a migraine she cannot shake  Got up on Saturday -- usual time and sat down to drink your coffee Had aura - blurry to read the scroll on TV - which was odd for her  Soon after - a piercing pain in her head - top and center - spread throughout her whole head  Just sharp and occ throbbing Then got very nauseated that am  Took some ibuprofen (3 of them otc) Took a nap in the dark  Felt quite a bit better when she woke up -not as severe - just constant Yesterday was mild - took ibuprofen This am -- trouble concentrating - misspelling words , then got sweaty and very nauseated again (has not taken her ibuprofen)  Came home from work and took her ibuprofen  Just does not feel like herself - family noticed it  Some photophobia   Ate chinese food that week  Had wine the night before   Now feels exhausted , pain is more dull , and generally discombobulated   caff- 1 coffee and 1 diet drink during the day  Goes to bed at 11 - up at 6:30 usually  Lately feels like her rest is not as good  Some snoring   Some shoulder pain R last mo- lying on it -like a crick   Stress has been up lately- at work , finished a wedding   Patient Active Problem List  Diagnosis  . Chest pain  . Rectal bleeding  . Endometriosis  . Depression  . Migraine  . Cough   Past Medical History  Diagnosis Date  . Depression   . History of chicken pox   . Blood in stool     bright red each time has BM for atleast 6 mths  . Headache   . Fainting spell   . Migraine   . Endometriosis   . Anemia     history  . GERD (gastroesophageal reflux disease)   . Chronic back pain greater than 3 months duration   . Anxiety     no meds   Past Surgical History  Procedure Date  . Back surgery [phone]    Dr Trey Sailors  . Laproscopy   . Knee surgery 2009    lateral realignment to lt knee  .  Diagnostic laparoscopy     x 2  . Svd     x 1  . Dilation and curettage of uterus   . Colonscopy    History  Substance Use Topics  . Smoking status: Never Smoker   . Smokeless tobacco: Never Used  . Alcohol Use: Yes     3-4 glasses of wine per month   Family History  Problem Relation Age of Onset  . Hyperlipidemia Mother   . Hypertension Mother   . Alcohol abuse Father   . Heart disease Father   . Stroke Father   . Kidney disease Father    Allergies  Allergen Reactions  . Penicillins Hives   Current Outpatient Prescriptions on File Prior to Visit  Medication Sig Dispense Refill  . Lysine 500 MG CAPS Take 1 capsule by mouth 2 (two) times daily.      Marland Kitchen albuterol (PROVENTIL HFA;VENTOLIN HFA) 108 (90 BASE) MCG/ACT inhaler Inhale 2 puffs into the lungs every 4 (four) hours as needed for wheezing.  1 Inhaler  0  . chlorpheniramine-HYDROcodone (TUSSIONEX) 10-8 MG/5ML LQCR Take 5 mLs by mouth every 12 (twelve) hours as needed. Sedation precautions  140 mL  0  . ibuprofen (ADVIL,MOTRIN) 800 MG tablet Prn menstrual cramps  30 tablet  0  . omeprazole (PRILOSEC) 20 MG capsule Take 1 capsule (20 mg total) by mouth daily.  30 capsule  5  . oxyCODONE-acetaminophen (PERCOCET) 5-325 MG per tablet Take 1 tablet by mouth every 6 (six) hours as needed. Maximum of 12 per day. Prescribed by GYN             No chance pregnant  Still has 1 ovary    Has not had migraines in a while- but used to get them a lot   Patient Active Problem List  Diagnosis  . Chest pain  . Rectal bleeding  . Endometriosis  . Depression  . Migraine aura, persistent  . Cough   Past Medical History  Diagnosis Date  . Depression   . History of chicken pox   . Blood in stool     bright red each time has BM for atleast 6 mths  . Headache   . Fainting spell   . Migraine   . Endometriosis   . Anemia     history  . GERD (gastroesophageal reflux disease)   . Chronic back pain greater than 3 months duration    . Anxiety     no meds   Past Surgical History  Procedure Date  . Back surgery [phone]    Dr Trey Sailors  . Laproscopy   . Knee surgery 2009    lateral realignment to lt knee  . Diagnostic laparoscopy     x 2  . Svd     x 1  . Dilation and curettage of uterus   . Colonscopy    History  Substance Use Topics  . Smoking status: Never Smoker   . Smokeless tobacco: Never Used  . Alcohol Use: Yes     3-4 glasses of wine per month   Family History  Problem Relation Age of Onset  . Hyperlipidemia Mother   . Hypertension Mother   . Alcohol abuse Father   . Heart disease Father   . Stroke Father   . Kidney disease Father    Allergies  Allergen Reactions  . Penicillins Hives   Current Outpatient Prescriptions on File Prior to Visit  Medication Sig Dispense Refill  . Lysine 500 MG CAPS Take 1 capsule by mouth 2 (two) times daily.      Marland Kitchen albuterol (PROVENTIL HFA;VENTOLIN HFA) 108 (90 BASE) MCG/ACT inhaler Inhale 2 puffs into the lungs every 4 (four) hours as needed for wheezing.  1 Inhaler  0  . chlorpheniramine-HYDROcodone (TUSSIONEX) 10-8 MG/5ML LQCR Take 5 mLs by mouth every 12 (twelve) hours as needed. Sedation precautions  140 mL  0  . ibuprofen (ADVIL,MOTRIN) 800 MG tablet Prn menstrual cramps  30 tablet  0  . omeprazole (PRILOSEC) 20 MG capsule Take 1 capsule (20 mg total) by mouth daily.  30 capsule  5  . oxyCODONE-acetaminophen (PERCOCET) 5-325 MG per tablet Take 1 tablet by mouth every 6 (six) hours as needed. Maximum of 12 per day. Prescribed by GYN       . promethazine (PHENERGAN) 25 MG tablet Take 1 tablet (25 mg total) by mouth every 8 (eight) hours as needed for nausea.  20 tablet  0  . SUMAtriptan (IMITREX) 100 MG tablet Take one pill by mouth as  needed for migraine  10 tablet  0      Review of Systems Review of Systems  Constitutional: Negative for fever, appetite change, and unexpected weight change. pos for fatigue Eyes: Negative for pain and pos for  migraine visual aura  Respiratory: Negative for cough and shortness of breath.   Cardiovascular: Negative for cp or palpitations    Gastrointestinal: Negative for nausea, diarrhea and constipation.  Genitourinary: Negative for urgency and frequency.  Skin: Negative for pallor or rash   Neurological: Negative for weakness, light-headedness, numbness and pos for headache .  Hematological: Negative for adenopathy. Does not bruise/bleed easily.  Psychiatric/Behavioral: Negative for dysphoric mood. The patient is not nervous/anxious. Pos for more stress lately        Objective:   Physical Exam  Constitutional: She appears well-developed and well-nourished. No distress.  HENT:  Head: Normocephalic and atraumatic.  Right Ear: External ear normal.  Left Ear: External ear normal.  Nose: Nose normal.  Mouth/Throat: Oropharynx is clear and moist.       No sinus or temporal tenderness  Eyes: Conjunctivae and EOM are normal. Pupils are equal, round, and reactive to light. Right eye exhibits no discharge. Left eye exhibits no discharge. No scleral icterus.       Fundi grossly nl   Neck: Normal range of motion. Neck supple. No JVD present. Carotid bruit is not present. No thyromegaly present.  Cardiovascular: Normal rate, regular rhythm and intact distal pulses.   Pulmonary/Chest: Effort normal and breath sounds normal. No respiratory distress. She has no wheezes.  Abdominal: Soft. Bowel sounds are normal. She exhibits no distension, no abdominal bruit and no mass. There is no tenderness.  Musculoskeletal: She exhibits no edema.  Lymphadenopathy:    She has no cervical adenopathy.  Neurological: She is alert. She has normal reflexes. She displays no atrophy and no tremor. No cranial nerve deficit or sensory deficit. She exhibits normal muscle tone. She displays a negative Romberg sign. Coordination and gait normal.       No focal cerebellar signs Headache is not severe at this time   Skin: Skin is  warm and dry. No rash noted. No erythema. No pallor.  Psychiatric: She has a normal mood and affect.          Assessment & Plan:

## 2011-12-07 NOTE — Telephone Encounter (Signed)
Will see her then 

## 2011-12-07 NOTE — Patient Instructions (Addendum)
Take phenergan as directed for nausea- watch out for sedation Take imitrex one time - update me if not improved tomorrow Lots of fluids, minimize caffeine, get some sleep  If severe - go to the ER  Follow up with me in 4-6 weeks

## 2011-12-07 NOTE — Assessment & Plan Note (Signed)
Since sat waxing and waning  Will try imitrex which helped in past  Suspect multifactorial Phenergan for nausea  Disc lifestyle change Will update if no relief tomorrow F/u 4-6 wk

## 2011-12-11 ENCOUNTER — Telehealth: Payer: Self-pay | Admitting: Family Medicine

## 2011-12-11 ENCOUNTER — Emergency Department: Payer: Self-pay | Admitting: Emergency Medicine

## 2011-12-11 NOTE — Telephone Encounter (Signed)
Thanks for the heads up - I'm glad she is going  I will be on the look out for records after she is seen

## 2011-12-11 NOTE — Telephone Encounter (Signed)
Pt called and said that you advised her to go to [facility] if her headaches got any more severe. She is heading to [facility] now she says her headache has gotten extremely bad and hasn't really gone away since she seen you last.

## 2012-11-24 ENCOUNTER — Ambulatory Visit (INDEPENDENT_AMBULATORY_CARE_PROVIDER_SITE_OTHER): Payer: Managed Care, Other (non HMO) | Admitting: Family Medicine

## 2012-11-24 ENCOUNTER — Encounter: Payer: Self-pay | Admitting: Family Medicine

## 2012-11-24 VITALS — BP 122/84 | HR 91 | Temp 98.1°F | Wt 204.8 lb

## 2012-11-24 DIAGNOSIS — J020 Streptococcal pharyngitis: Secondary | ICD-10-CM

## 2012-11-24 DIAGNOSIS — J069 Acute upper respiratory infection, unspecified: Secondary | ICD-10-CM

## 2012-11-24 MED ORDER — BENZONATATE 200 MG PO CAPS: 200.0000 mg | ORAL_CAPSULE | Freq: Three times a day (TID) | ORAL | Status: DC | PRN

## 2012-11-24 MED ORDER — LIDOCAINE VISCOUS 2 % MT SOLN
5.0000 mL | OROMUCOSAL | Status: DC | PRN
Start: 2012-11-24 — End: 2012-11-28

## 2012-11-24 NOTE — Progress Notes (Signed)
Sx started 4 days ago.  Initially with stool changes and nausea w/o vomiting. Dec in appetite.  Then ST, burning in the throat.  Minimal cough. No fevers.  Dry cough.  B ear "tickling like drainage".  Clear rhinorrhea, to the point of dripping. ST bothers her the most now.  Fatigued.  Tried gargling with salt water today, mild relief.  Possible sick contacts, daughter didn't feel well.   Meds, vitals, and allergies reviewed.   ROS: See HPI.  Otherwise, noncontributory.  GEN: nad, alert and oriented HEENT: mucous membranes moist, tm w/o erythema, nasal exam w/o erythema, clear discharge noted,  OP with cobblestoning, L max sinus very minimally ttp NECK: supple w/o LA CV: rrr.   PULM: ctab, no inc wob EXT: no edema SKIN: no acute rash  RST neg.

## 2012-11-24 NOTE — Patient Instructions (Signed)
Drink plenty of fluids, take ibuprofen/tylenol as needed, and gargle with warm salt water for your throat.  This should gradually improve.  Take care.  Let us know if you have other concerns.  Use tessalon for cough and lidocaine for the sore throat.

## 2012-11-27 DIAGNOSIS — J069 Acute upper respiratory infection, unspecified: Secondary | ICD-10-CM | POA: Insufficient documentation

## 2012-11-27 NOTE — Assessment & Plan Note (Signed)
Likely viral, nontoxic. F/u prn.  Supportive tx in meantime. She agrees.  ddx d/w pt.

## 2012-11-28 ENCOUNTER — Ambulatory Visit (INDEPENDENT_AMBULATORY_CARE_PROVIDER_SITE_OTHER): Payer: Managed Care, Other (non HMO) | Admitting: Family Medicine

## 2012-11-28 ENCOUNTER — Encounter: Payer: Self-pay | Admitting: Family Medicine

## 2012-11-28 VITALS — BP 120/80 | HR 80 | Temp 98.0°F | Wt 201.0 lb

## 2012-11-28 DIAGNOSIS — R05 Cough: Secondary | ICD-10-CM

## 2012-11-28 MED ORDER — AZITHROMYCIN 250 MG PO TABS: ORAL_TABLET | ORAL | Status: DC

## 2012-11-28 NOTE — Patient Instructions (Addendum)
Start the antibiotics, gets some rest and drink plenty of fluids.  Take care.

## 2012-11-28 NOTE — Assessment & Plan Note (Signed)
H/o pleurisy, with return of similar sx, with inc in chest sx/sputum, sx going on for >1 week.  Nontoxic.  Would defer imaging as it wouldn't change mgmt.  Start abx today, continue tessalon for cough, fu prn.  She agrees.

## 2012-11-28 NOTE — Progress Notes (Signed)
Since last OV, ST resolved, cough is generally some better with tessalon. Some cough today noted.  In interval no fevers but voice is altered and chest feels tight, started yesterday.  This feels like prev pleurisy but not as bad as prev.  Chest is diffusely sore. Some pain with a deep breath.  Deep breath triggers a cough.  Fatigued.  Sleep is disrupted.  She has used SABA prev in distant past but has HA with that. Recently with discolored sputum; recent change per report.   Meds, vitals, and allergies reviewed.   ROS: See HPI.  Otherwise, noncontributory.  GEN: nad, alert and oriented HEENT: mucous membranes moist, tm w/o erythema, nasal exam w/o erythema, clear discharge noted, OP with cobblestoning NECK: supple w/o LA CV: rrr.   PULM: cough noted but ctab, no inc wob. EXT: no edema SKIN: no acute rash Voice is hoarse

## 2013-02-02 ENCOUNTER — Emergency Department: Payer: Self-pay | Admitting: Emergency Medicine

## 2013-02-06 ENCOUNTER — Ambulatory Visit (INDEPENDENT_AMBULATORY_CARE_PROVIDER_SITE_OTHER)
Admission: RE | Admit: 2013-02-06 | Discharge: 2013-02-06 | Disposition: A | Discharge status: Home / Self Care | Payer: Managed Care, Other (non HMO) | Source: Ambulatory Visit | Attending: Family Medicine | Admitting: Family Medicine

## 2013-02-06 ENCOUNTER — Ambulatory Visit (INDEPENDENT_AMBULATORY_CARE_PROVIDER_SITE_OTHER): Payer: Managed Care, Other (non HMO) | Admitting: Family Medicine

## 2013-02-06 ENCOUNTER — Encounter: Payer: Self-pay | Admitting: Family Medicine

## 2013-02-06 DIAGNOSIS — M542 Cervicalgia: Secondary | ICD-10-CM

## 2013-02-06 DIAGNOSIS — R51 Headache: Secondary | ICD-10-CM

## 2013-02-06 DIAGNOSIS — R519 Headache, unspecified: Secondary | ICD-10-CM | POA: Insufficient documentation

## 2013-02-06 NOTE — Assessment & Plan Note (Signed)
Neck pain and ha  CS flm CT of head to r/o subduran hematoma

## 2013-02-06 NOTE — Patient Instructions (Addendum)
Xray of neck today  Stop up front for referral for CT of the head  If your symptoms worsen let me know  Continue muscle relaxer as needed when not working or driving Use pain medication if you really need it  Try heat on your neck  Also a foam pillow  Update if not starting to improve in 2-3 days  or if worsening
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
## 2013-02-06 NOTE — Assessment & Plan Note (Signed)
With some tenderness CS xrays nl adn nl rom with soreness Suspect spasm Enc use of muscle relaxer (when not working) and heat If no imp- consider PT ref
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
## 2013-02-14 ENCOUNTER — Telehealth: Payer: Self-pay | Admitting: *Deleted

## 2013-02-14 DIAGNOSIS — M542 Cervicalgia: Secondary | ICD-10-CM

## 2013-02-14 NOTE — Telephone Encounter (Signed)
I'm going to refer her to PT Let her know we will be calling

## 2013-02-14 NOTE — Telephone Encounter (Signed)
Pt advised referral for PT done

## 2013-02-14 NOTE — Telephone Encounter (Signed)
Pt left voicemail to update status.   Pt was seen on 02/06/13 and since then her neck pain has gotten worse. Pt said her neck pain is sever and it is traveling down her back and now she is getting this strange feeling in her head, but she knows that her xray and CT were both normal. Pt wanted to know what the next step should be (?PT), please advise

## 2013-03-29 ENCOUNTER — Other Ambulatory Visit: Payer: Self-pay | Admitting: Obstetrics & Gynecology

## 2013-03-29 DIAGNOSIS — R109 Unspecified abdominal pain: Secondary | ICD-10-CM

## 2013-03-30 ENCOUNTER — Other Ambulatory Visit: Payer: Self-pay

## 2013-04-03 ENCOUNTER — Other Ambulatory Visit: Payer: Managed Care, Other (non HMO)

## 2013-06-30 ENCOUNTER — Ambulatory Visit (INDEPENDENT_AMBULATORY_CARE_PROVIDER_SITE_OTHER): Payer: BC Managed Care – PPO | Admitting: Internal Medicine

## 2013-06-30 ENCOUNTER — Encounter: Payer: Self-pay | Admitting: Internal Medicine

## 2013-06-30 VITALS — BP 120/72 | HR 94 | Temp 98.2°F | Wt 206.0 lb

## 2013-06-30 DIAGNOSIS — R059 Cough, unspecified: Secondary | ICD-10-CM

## 2013-06-30 DIAGNOSIS — R05 Cough: Secondary | ICD-10-CM

## 2013-06-30 DIAGNOSIS — J069 Acute upper respiratory infection, unspecified: Secondary | ICD-10-CM

## 2013-06-30 MED ORDER — BENZONATATE 200 MG PO CAPS: 200.0000 mg | ORAL_CAPSULE | Freq: Two times a day (BID) | ORAL | Status: DC | PRN

## 2013-06-30 NOTE — Progress Notes (Signed)
Pre-visit discussion using our clinic review tool. No additional management support is needed unless otherwise documented below in the visit note.  

## 2013-06-30 NOTE — Patient Instructions (Signed)

## 2013-06-30 NOTE — Progress Notes (Signed)
HPI  Pt presents to the clinic today with c/o cough and body aches. She reports this started 2 weeks. She reports the cough is non productive. She denies fever or other associated symptoms. She has taken left over tessalon pearls which has helped. She reports she has been exposed to a child just diagnosed with the flu. She has no history of allergies or breathing problems.  Review of Systems      Past Medical History  Diagnosis Date  . Depression   . History of chicken pox   . Blood in stool     bright red each time has BM for atleast 6 mths  . Headache(784.0)   . Fainting spell   . Migraine   . Endometriosis   . Anemia     history  . GERD (gastroesophageal reflux disease)   . Chronic back pain greater than 3 months duration   . Anxiety     no meds    Family History  Problem Relation Age of Onset  . Hyperlipidemia Mother   . Hypertension Mother   . Alcohol abuse Father   . Heart disease Father   . Stroke Father   . Kidney disease Father     History   Social History  . Marital Status: Single    Spouse Name: N/A    Number of Children: N/A  . Years of Education: N/A   Occupational History  . Not on file.   Social History Main Topics  . Smoking status: Never Smoker   . Smokeless tobacco: Never Used  . Alcohol Use: Yes     Comment: 3-4 glasses of wine per month  . Drug Use: No  . Sexual Activity: Yes     Comment: Depo   Other Topics Concern  . Not on file   Social History Narrative  . No narrative on file    Allergies  Allergen Reactions  . Albuterol     Headaches  . Penicillins Hives     Constitutional: Positive fatigue. Denies headache, fever or abrupt weight changes.  HEENT:  Denies eye redness, eye pain, pressure behind the eyes, facial pain, nasal congestion, ear pain, ringing in the ears, wax buildup, runny nose or bloody nose. Respiratory: Positive cough. Denies difficulty breathing or shortness of breath.  Cardiovascular: Denies chest pain,  chest tightness, palpitations or swelling in the hands or feet.   No other specific complaints in a complete review of systems (except as listed in HPI above).  Objective:   BP 120/72  Pulse 94  Temp(Src) 98.2 F (36.8 C) (Oral)  Wt 206 lb (93.441 kg)  SpO2 99%  LMP 10/07/2010 Wt Readings from Last 3 Encounters:  06/30/13 206 lb (93.441 kg)  02/06/13 208 lb 8 oz (94.575 kg)  11/28/12 201 lb (91.173 kg)     General: Appears her stated age, well developed, well nourished in NAD. HEENT: Head: normal shape and size; Eyes: sclera white, no icterus, conjunctiva pink, PERRLA and EOMs intact; Ears: Tm's gray and intact, normal light reflex; Nose: mucosa pink and moist, septum midline; Throat/Mouth: + PND. Teeth present, mucosa erythematous and moist, no exudate noted, no lesions or ulcerations noted.  Neck: Mild cervical lymphadenopathy. Neck supple, trachea midline. No massses, lumps or thyromegaly present.  Cardiovascular: Normal rate and rhythm. S1,S2 noted.  No murmur, rubs or gallops noted. No JVD or BLE edema. No carotid bruits noted. Pulmonary/Chest: Normal effort and positive vesicular breath sounds. No respiratory distress. No wheezes, rales or ronchi  noted.      Assessment & Plan:   Viral URI with cough:  Get some rest and drink plenty of water Do salt water gargles for the sore throat Tessalon Pearls refilled Call me on Monday if no improvement- can call in a zpack  RTC as needed or if symptoms persist.

## 2013-07-03 ENCOUNTER — Telehealth: Payer: Self-pay | Admitting: Family Medicine

## 2013-07-03 ENCOUNTER — Other Ambulatory Visit: Payer: Self-pay | Admitting: Internal Medicine

## 2013-07-03 MED ORDER — AZITHROMYCIN 250 MG PO TABS: ORAL_TABLET | ORAL | Status: DC

## 2013-07-03 NOTE — Telephone Encounter (Signed)
Pt notified abx sent to pharmacy

## 2013-07-03 NOTE — Telephone Encounter (Signed)
Pt called and states that she is getting worse and now her cold has moved up into her head. She was told to call back if she did not get any better over the weekend. Please advise.

## 2013-07-03 NOTE — Telephone Encounter (Signed)
See prev note, you saw pt on 06/30/13

## 2013-07-03 NOTE — Telephone Encounter (Signed)
zpack called in.

## 2013-07-06 ENCOUNTER — Telehealth: Payer: Self-pay

## 2013-07-06 DIAGNOSIS — B001 Herpesviral vesicular dermatitis: Secondary | ICD-10-CM | POA: Insufficient documentation

## 2013-07-06 MED ORDER — VALACYCLOVIR HCL 1 G PO TABS: ORAL_TABLET | ORAL | Status: DC

## 2013-07-06 NOTE — Telephone Encounter (Signed)
Pt left v/m requesting med for fever blisters sent to [facility]. Pt has 3 fever blisters on lip now. Pt has hx of fever blisters. Lysine not helping.Please advise. Pt request cb.

## 2013-07-06 NOTE — Telephone Encounter (Signed)
I sent valtrex there-try that and f/u if no improvement- there is no cure but this may help symptoms resolve sooner

## 2013-07-06 NOTE — Telephone Encounter (Signed)
Left voicemail letting pt know Rx sent to pharmacy and to f/u if no improvement  

## 2013-08-28 ENCOUNTER — Other Ambulatory Visit: Payer: Self-pay

## 2013-08-28 DIAGNOSIS — Z1231 Encounter for screening mammogram for malignant neoplasm of breast: Secondary | ICD-10-CM

## 2013-09-07 ENCOUNTER — Telehealth: Payer: Self-pay | Admitting: Family Medicine

## 2013-09-07 DIAGNOSIS — J069 Acute upper respiratory infection, unspecified: Secondary | ICD-10-CM

## 2013-09-07 DIAGNOSIS — R059 Cough, unspecified: Secondary | ICD-10-CM

## 2013-09-07 DIAGNOSIS — R05 Cough: Secondary | ICD-10-CM

## 2013-09-07 MED ORDER — BENZONATATE 200 MG PO CAPS: 200.0000 mg | ORAL_CAPSULE | Freq: Two times a day (BID) | ORAL | Status: DC | PRN

## 2013-09-07 NOTE — Telephone Encounter (Signed)
Left voicemail letting pt know we refilled tessalon Rx but she would need to keep appt with Webb Silversmith, NP tomorrow because we can't prescribe abx without pt being seen 1st

## 2013-09-07 NOTE — Telephone Encounter (Signed)
Pt called and is sick again just like in February 2015.  Feels that the cough is getting worse and going into her chest.  She is requesting a refill on Tessalon and wants an antibiotic without coming in to office?   Scheduled an appointment to see Webb Silversmith 09/08/13.  Pharmacy of choice [facility] in [place].  Best number to call patient [phone]

## 2013-09-07 NOTE — Telephone Encounter (Signed)
Please refill her tessalon - but cannot do abx without visit- so keep appt with [facility]

## 2013-09-08 ENCOUNTER — Ambulatory Visit: Payer: BC Managed Care – PPO | Admitting: Internal Medicine

## 2013-09-14 ENCOUNTER — Ambulatory Visit
Admission: RE | Admit: 2013-09-14 | Discharge: 2013-09-14 | Disposition: A | Discharge status: Home / Self Care | Payer: Self-pay | Source: Ambulatory Visit

## 2013-09-14 DIAGNOSIS — Z1231 Encounter for screening mammogram for malignant neoplasm of breast: Secondary | ICD-10-CM

## 2014-01-10 ENCOUNTER — Ambulatory Visit (INDEPENDENT_AMBULATORY_CARE_PROVIDER_SITE_OTHER)
Admission: RE | Admit: 2014-01-10 | Discharge: 2014-01-10 | Disposition: A | Discharge status: Home / Self Care | Payer: BC Managed Care – PPO | Source: Ambulatory Visit | Attending: Family Medicine | Admitting: Family Medicine

## 2014-01-10 ENCOUNTER — Encounter: Payer: Self-pay | Admitting: Family Medicine

## 2014-01-10 ENCOUNTER — Ambulatory Visit (INDEPENDENT_AMBULATORY_CARE_PROVIDER_SITE_OTHER): Payer: BC Managed Care – PPO | Admitting: Family Medicine

## 2014-01-10 VITALS — BP 114/78 | HR 68 | Temp 98.5°F | Ht 68.0 in | Wt 206.5 lb

## 2014-01-10 DIAGNOSIS — R0789 Other chest pain: Secondary | ICD-10-CM

## 2014-01-10 DIAGNOSIS — R079 Chest pain, unspecified: Secondary | ICD-10-CM

## 2014-01-10 DIAGNOSIS — M542 Cervicalgia: Secondary | ICD-10-CM

## 2014-01-10 DIAGNOSIS — R42 Dizziness and giddiness: Secondary | ICD-10-CM | POA: Insufficient documentation

## 2014-01-10 MED ORDER — MELOXICAM 7.5 MG PO TABS: 7.5000 mg | ORAL_TABLET | Freq: Every day | ORAL | Status: DC

## 2014-01-10 MED ORDER — OMEPRAZOLE 20 MG PO CPDR: 20.0000 mg | DELAYED_RELEASE_CAPSULE | Freq: Every day | ORAL | Status: DC

## 2014-01-10 NOTE — Assessment & Plan Note (Signed)
Night time Also burping  Some tenderness Diff incl costochondritis /cwp/ gerd  EKG reassuring but if no imp would consider cardiac eval  Will try prilosec 20 mg daily  Also meloxicam with food daily prn  Warm compress on chest  cxr today and update -if nl will schedule f/u

## 2014-01-10 NOTE — Patient Instructions (Signed)
EKG looks good  Start prilosec one pill daily (starting today) meloxicam (an anti inflammatory) once daily with food for neck pain Avoid caffeine and acidic food/beverages  Chest xray today  Will make plan from there   Make sure to drink enough fluids

## 2014-01-10 NOTE — Assessment & Plan Note (Signed)
Strain and stiffness on and off Better today Disc use of heat and cervical supp pillow Trial of meloxicam 7.5 daily as needed with food (watching for GI irritation) Perhaps PT may be helpful in future

## 2014-01-10 NOTE — Progress Notes (Signed)
Pre visit review using our clinic review tool, if applicable. No additional management support is needed unless otherwise documented below in the visit note. 

## 2014-01-10 NOTE — Assessment & Plan Note (Signed)
Transient Not assoc with cp or other symptoms Nl exam today  Will watch out for ha or other symptoms and disc further at f/u

## 2014-01-10 NOTE — Assessment & Plan Note (Signed)
>>  ASSESSMENT AND PLAN FOR NECK PAIN WRITTEN ON 01/10/2014  1:10 PM BY TOWER, MARNE A, MD  Strain and stiffness on and off Better today Disc use of heat and cervical supp pillow Trial of meloxicam 7.5 daily as needed with food (watching for GI irritation) Perhaps PT may be helpful in future

## 2014-01-10 NOTE — Progress Notes (Signed)
Subjective:    Patient ID: [name] Orr, female    DOB: 27-Jun-1970, 43 y.o.   MRN: [id number]  HPI Here for multiple symptoms   Had a bad night last night  Chest pain (last night was most severe it has been)- like a sharp pain under her L breast and it radiated to her arm (also made arm feel funny and weak) - perhaps a 6/10 on pain scale lasting about an hour  Not exertional  In fact got up and walked around a bit  No sob/ sweating/ no nausea  Some burping  No heartburn lately   Neck pain - had that a few days ago  Hurts over base of neck- over spine and it is tender to the touch  Hurts to turn her neck  Took ibuprofen  Better today    Dizzy at times  Noticed it first in the shower after she bent over and stood back up  Almost fell  It happens in a fleeting fashion at different times  Not always with a change in position   As a matter of fact the last few nights she lies down and chest feels generally weird and occ sharp pain Going on for the past month or so   Stressors: single mother of a 43 year old girl  Uncle had MI-that traumatized her a bit  Financial stress  Stress at work  No energy - too tired to exercise  She has slept fairly well -turning TV off now (does not wake up)  Overall not as bad as it had been in the past   Nl EKG today NSR with rate of 66   Patient Active Problem List   Diagnosis Date Noted  . Chest pain, atypical 01/10/2014  . Neck pain 01/10/2014  . Dizziness 01/10/2014  . Fever blister 07/06/2013  . Neck pain, bilateral 02/06/2013  . Depression 10/12/2010  . Migraine aura, persistent 10/12/2010  . Endometriosis 10/10/2010   Past Medical History  Diagnosis Date  . Depression   . History of chicken pox   . Blood in stool     bright red each time has BM for atleast 6 mths  . Headache(784.0)   . Fainting spell   . Migraine   . Endometriosis   . Anemia     history  . GERD (gastroesophageal reflux disease)   . Chronic  back pain greater than 3 months duration   . Anxiety     no meds   Past Surgical History  Procedure Laterality Date  . Back surgery  [phone]    Dr Glenna Fellows  . Laproscopy    . Knee surgery  2009    lateral realignment to lt knee  . Diagnostic laparoscopy      x 2  . Svd      x 1  . Dilation and curettage of uterus    . Colonscopy     History  Substance Use Topics  . Smoking status: Never Smoker   . Smokeless tobacco: Never Used  . Alcohol Use: Yes     Comment: 3-4 glasses of wine per month   Family History  Problem Relation Age of Onset  . Hyperlipidemia Mother   . Hypertension Mother   . Alcohol abuse Father   . Heart disease Father   . Stroke Father   . Kidney disease Father    Allergies  Allergen Reactions  . Albuterol     Headaches  . Penicillins Hives  Current Outpatient Prescriptions on File Prior to Visit  Medication Sig Dispense Refill  . valACYclovir (VALTREX) 1000 MG tablet Take 2 pills by mouth bid for one day for fever blisters  4 tablet  3   No current facility-administered medications on file prior to visit.    Review of Systems    Review of Systems  Constitutional: Negative for fever, appetite change, and unexpected weight change. pos for fatigue  ENT neg for ST or sinus pain  Eyes: Negative for pain and visual disturbance.  Respiratory: Negative for cough and shortness of breath.   Cardiovascular: Negative for  palpitations   neg for pedal edema or PND or orthopnea  Gastrointestinal: Negative for nausea, diarrhea and constipation.  Genitourinary: Negative for urgency and frequency.  Skin: Negative for pallor or rash   MSK pos for intermittent positional neck pain  Neurological: Negative for weakness, light-headedness, numbness and headaches.  Hematological: Negative for adenopathy. Does not bruise/bleed easily.  Psychiatric/Behavioral: Negative for dysphoric mood. The patient is not nervous/anxious.  pos for stressors       Objective:   Physical Exam  Constitutional: She appears well-developed and well-nourished. No distress.  obese and well appearing   HENT:  Head: Normocephalic and atraumatic.  Right Ear: External ear normal.  Left Ear: External ear normal.  Nose: Nose normal.  Mouth/Throat: Oropharynx is clear and moist.  Eyes: Conjunctivae and EOM are normal. Pupils are equal, round, and reactive to light. Right eye exhibits no discharge. Left eye exhibits no discharge. No scleral icterus.  Neck: Normal range of motion. Neck supple. No JVD present. No thyromegaly present.  Cardiovascular: Normal rate, regular rhythm, normal heart sounds and intact distal pulses.  Exam reveals no gallop and no friction rub.   No murmur heard. Pulmonary/Chest: Effort normal and breath sounds normal. No respiratory distress. She has no wheezes. She has no rales. She exhibits tenderness.  Mild sternal and xyphoid tenderness   Abdominal: Soft. Bowel sounds are normal. She exhibits no distension and no mass. There is no tenderness. There is no rebound and no guarding.  Musculoskeletal: She exhibits no edema and no tenderness.  Lymphadenopathy:    She has no cervical adenopathy.  Neurological: She is alert. She has normal reflexes. No cranial nerve deficit. She exhibits normal muscle tone. Coordination normal.  Skin: Skin is warm and dry. No rash noted. No erythema. No pallor.  Psychiatric: She has a normal mood and affect.  Not seemingly anxious           Assessment & Plan:   Problem List Items Addressed This Visit     Other   Chest pain, atypical     Night time Also burping  Some tenderness Diff incl costochondritis /cwp/ gerd  EKG reassuring but if no imp would consider cardiac eval  Will try prilosec 20 mg daily  Also meloxicam with food daily prn  Warm compress on chest  cxr today and update -if nl will schedule f/u     Relevant Orders      DG Chest 2 View   Neck pain     Strain and stiffness on and  off Better today Disc use of heat and cervical supp pillow Trial of meloxicam 7.5 daily as needed with food (watching for GI irritation) Perhaps PT may be helpful in future     Dizziness     Transient Not assoc with cp or other symptoms Nl exam today  Will watch out for ha or other symptoms  and disc further at f/u     Other Visit Diagnoses   Chest pain, unspecified    -  Primary    Relevant Orders       EKG 12-Lead (Completed)

## 2014-03-01 ENCOUNTER — Ambulatory Visit (INDEPENDENT_AMBULATORY_CARE_PROVIDER_SITE_OTHER): Payer: BC Managed Care – PPO | Admitting: Podiatry

## 2014-03-01 ENCOUNTER — Encounter: Payer: Self-pay | Admitting: Podiatry

## 2014-03-01 ENCOUNTER — Ambulatory Visit (INDEPENDENT_AMBULATORY_CARE_PROVIDER_SITE_OTHER): Payer: BC Managed Care – PPO

## 2014-03-01 VITALS — BP 125/78 | HR 69 | Resp 16 | Ht 68.0 in | Wt 197.0 lb

## 2014-03-01 DIAGNOSIS — M21611 Bunion of right foot: Secondary | ICD-10-CM

## 2014-03-01 DIAGNOSIS — M79673 Pain in unspecified foot: Secondary | ICD-10-CM

## 2014-03-01 DIAGNOSIS — M2011 Hallux valgus (acquired), right foot: Secondary | ICD-10-CM

## 2014-03-01 DIAGNOSIS — M2012 Hallux valgus (acquired), left foot: Secondary | ICD-10-CM

## 2014-03-01 DIAGNOSIS — M21612 Bunion of left foot: Principal | ICD-10-CM

## 2014-03-01 NOTE — Patient Instructions (Signed)
Bunionectomy A bunionectomy is surgery to remove a bunion. A bunion is an enlargement of the joint at the base of the big toe. It is made up of bone and soft tissue on the inside part of the joint. Over time, a painful lump appears on the inside of the joint. The big toe begins to point inward toward the second toe. New bone growth can occur and a bone spur may form. The pain eventually causes difficulty walking. A bunion usually results from inflammation caused by the irritation of poorly fitting shoes. It often begins later in life. A bunionectomy is performed when nonsurgical treatment no longer works. When surgery is needed, the extent of the procedure will depend on the degree of deformity of the foot. Your surgeon will discuss with you the different procedures and what will work best for you depending on your age and health. LET YOUR CAREGIVER KNOW ABOUT:   Previous problems with anesthetics or medicines used to numb the skin.  Allergies to dyes, iodine, foods, and/or latex.  Medicines taken including herbs, eye drops, prescription medicines (especially medicines used to "thin the blood"), aspirin and other over-the-counter medicines, and steroids (by mouth or as a cream).  History of bleeding or blood problems.  Possibility of pregnancy, if this applies.  History of blood clots in your legs and/or lungs .  Previous surgery.  Other important health problems. RISKS AND COMPLICATIONS   Infection.  Pain.  Nerve damage.  Possibility that the bunion will recur. BEFORE THE PROCEDURE  You should be present 60 minutes prior to your procedure or as directed.  PROCEDURE  Surgery is often done so that you can go home the same day (outpatient). It may be done in a hospital or in an outpatient surgical center. An anesthetic will be used to help you sleep during the procedure. Sometimes, a spinal anesthetic is used to make you numb below the waist. A cut (incision) is made over the swollen  area at the first joint of the big toe. The enlarged lump will be removed. If there is a need to reposition the bones of the big toe, this may require more than 1 incision. The bone itself may need to be cut. Screws and wires may be used in the repair. These can be removed at a later date. In severe cases, the entire joint may need to be removed and a joint replacement inserted. When done, the incision is closed with stitches (sutures). Skin adhesive strips may be added for reinforcement. They help hold the incision closed.  AFTER THE PROCEDURE  Compression bandages (dressings) are then wrapped around the wound. This helps to keep the foot in alignment and reduce swelling. Your foot will be monitored for bleeding and swelling. You will need to stay for a few hours in the recovery area before being discharged. This allows time for the anesthesia to wear off. You will be discharged home when you are awake, stable, and doing well. HOME CARE INSTRUCTIONS   You can expect to return to normal activities within 6 to 8 weeks after surgery. The foot is at increased risk for swelling for several months. When you can expect to bear weight on the operated foot will depend on the extent of your surgery. The milder the deformity, the less tissue is removed and the sooner the return to normal activity level. During the recovery period, a special shoe, boot, or cast may be worn to accommodate the surgical bandage and to help provide stability   to the foot.  Once you are home, an ice pack applied to the operative site may help with discomfort and keep swelling down. Stop using the ice if it causes discomfort.  Keep your feet raised (elevated) when possible to lessen swelling.  If you have an elastic bandage on your foot and you have numbness, tingling, or your foot becomes cold and blue, adjust the bandage to make it comfortable.  Change dressings as directed.  Keep the wound dry and clean. The wound may be washed  gently with soap and water. Gently blot dry without rubbing. Do not take baths or use swimming pools or hot tubs for 10 days, or as instructed by your caregiver.  Only take over-the-counter or prescription medicines for pain, discomfort, or fever as directed by your caregiver.  You may continue a normal diet as directed.  For activity, use crutches with no weight bearing or your orthopedic shoe as directed. Continue to use crutches or a cane as directed until you can stand without causing pain. SEEK MEDICAL CARE IF:   You have redness, swelling, bruising, or increasing pain in the wound.  There is pus coming from the wound.  You have drainage from a wound lasting longer than 1 day.  You have an oral temperature above 102 F (38.9 C).  You notice a bad smell coming from the wound or dressing.  The wound breaks open after sutures have been removed.  You develop dizzy episodes or fainting while standing.  You have persistent nausea or vomiting.  Your toes become cold.  Pain is not relieved with medicines. SEEK IMMEDIATE MEDICAL CARE IF:   You develop a rash.  You have difficulty breathing.  You develop any reaction or side effects to medicines given.  Your toes are numb or blue, or you have severe pain. MAKE SURE YOU:   Understand these instructions.  Will watch your condition.  Will get help right away if you are not doing well or get worse. Document Released: 04/24/2005 Document Revised: 08/03/2011 Document Reviewed: 05/30/2007 Exit[employer] Patient Information 2015 [employer]. This information is not intended to replace advice given to you by your health care provider. Make sure you discuss any questions you have with your health care provider.  

## 2014-03-01 NOTE — Progress Notes (Signed)
   Subjective:    Patient ID: [name] Orr, female    DOB: [birth year], 43 y.o.   MRN: [id number]  HPI Comments: Ms. Alden since the office they with complaints of bilateral bunion pain with a right greater than left. She states the bunions have been present for many years however the pain has been increasing over the last year. States that the pain is mostly along the bump on the inside aspect of her foot directly over the bunion. Pain is worse with shoe gear and with running. She has attempted multiple forms of conservative treatment including padding, offloading, shoe gear changes, and anti-inflammatory medications. At this time she is considering other options to help alleviate her symptoms. No other complaints at this time.  Foot Pain      Review of Systems  All other systems reviewed and are negative.      Objective:   Physical Exam AAO x3, NAD DP/PT pulses palpable bilaterally, CRT less than 3 seconds. Protective sensation intact with Derrel Nip monofilament, vibratory sensation intact, Achilles tendon reflex intact. Bilateral bunion deformity. No pain with crepitus with first MTPJ range of motion bilaterally. Irritation over the medial aspect of the first metatarsal head bilaterally. No hypermobility. Bilateral mild equinus. Decrease in medial arch height upon weightbearing. No calf pain, swelling, warmth. No open lesions.      Assessment & Plan:  43 year old female with bilateral structural HAV deformity with right greater than left. -X-rays were obtained and reviewed the patient. -Conservative versus surgical treatment were discussed the patient including alternatives, risks, complications. Etiology discussed. -At this time patient has attempted conservative treatment and is inquiring about surgical intervention. Discussed with her most likely surgical procedure would be an offset-V bunionectomy with screw fixation. Postoperative course was discussed the  patient. Patient will consider surgical intervention will call the office.  -Followup as needed. Call the office with any questions, concerns, change in symptoms.

## 2014-08-06 ENCOUNTER — Other Ambulatory Visit: Payer: Self-pay | Admitting: Family Medicine

## 2014-11-21 ENCOUNTER — Ambulatory Visit (INDEPENDENT_AMBULATORY_CARE_PROVIDER_SITE_OTHER)
Admission: RE | Admit: 2014-11-21 | Discharge: 2014-11-21 | Disposition: A | Discharge status: Home / Self Care | Payer: BLUE CROSS/BLUE SHIELD | Source: Ambulatory Visit | Attending: Family Medicine | Admitting: Family Medicine

## 2014-11-21 ENCOUNTER — Ambulatory Visit (INDEPENDENT_AMBULATORY_CARE_PROVIDER_SITE_OTHER): Payer: BLUE CROSS/BLUE SHIELD | Admitting: Family Medicine

## 2014-11-21 ENCOUNTER — Encounter: Payer: Self-pay | Admitting: Family Medicine

## 2014-11-21 VITALS — BP 118/78 | HR 82 | Temp 98.4°F | Ht 68.0 in | Wt 207.2 lb

## 2014-11-21 DIAGNOSIS — M542 Cervicalgia: Secondary | ICD-10-CM

## 2014-11-21 MED ORDER — MELOXICAM 7.5 MG PO TABS: 7.5000 mg | ORAL_TABLET | Freq: Every day | ORAL | Status: DC

## 2014-11-21 MED ORDER — CYCLOBENZAPRINE HCL 10 MG PO TABS: 10.0000 mg | ORAL_TABLET | Freq: Three times a day (TID) | ORAL | Status: DC | PRN
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
## 2014-11-21 NOTE — Patient Instructions (Signed)
Continue heat on neck  Try mobic daily with food  Flexeril as needed with caution of sedation   Xray now - we will have a result tomorrow   Stop at check out re: physical therapy referral

## 2014-11-21 NOTE — Progress Notes (Signed)
Pre visit review using our clinic review tool, if applicable. No additional management support is needed unless otherwise documented below in the visit note. 

## 2014-11-22 NOTE — Assessment & Plan Note (Signed)
>>  ASSESSMENT AND PLAN FOR NECK PAIN WRITTEN ON 11/22/2014  2:43 PM BY TOWER, MARNE A, MD  Acute on chronic  Now causing radicular symptoms in R arm  Px mobic and flexeril-helpful in the past  CS xr now  Ref to PT

## 2014-11-22 NOTE — Assessment & Plan Note (Signed)
Acute on chronic  Now causing radicular symptoms in R arm  Px mobic and flexeril-helpful in the past  CS xr now  Ref to PT

## 2015-06-20 ENCOUNTER — Other Ambulatory Visit: Payer: Self-pay | Admitting: Family Medicine

## 2015-06-20 NOTE — Telephone Encounter (Signed)
Will refill electronically  

## 2015-06-20 NOTE — Telephone Encounter (Signed)
Pt had an acute appt on 11/21/14, but no recent f/u or CPE, please advise

## 2015-07-12 ENCOUNTER — Telehealth: Payer: Self-pay | Admitting: Family Medicine

## 2015-07-12 MED ORDER — OSELTAMIVIR PHOSPHATE 75 MG PO CAPS: 75.0000 mg | ORAL_CAPSULE | Freq: Every day | ORAL | Status: DC

## 2015-07-12 NOTE — Telephone Encounter (Signed)
Patient advised.

## 2015-07-12 NOTE — Telephone Encounter (Signed)
Pt called stating that her daughter was diagnosed with the flu. And she would like to get  Something to help her from getting the flu  [facility]  Mom on way to pick up daughter meds now

## 2015-07-12 NOTE — Telephone Encounter (Signed)
tamiflu for prophylaxis 75 mg daily for 7 days Also wear a mask if she is around her as much as she can  I sent it electronically

## 2015-08-19 ENCOUNTER — Telehealth: Payer: Self-pay | Admitting: Family Medicine

## 2015-08-19 NOTE — Telephone Encounter (Signed)
[facility]  Patient Name: [name] Orr  DOB: 11/14/1970    Initial Comment Caller states c/o chest congestion, cough, back pain from coughing, headache   Nurse Assessment  Nurse: Wayne Sever, RN, Tillie Rung Date/Time (Eastern Time): 08/19/2015 3:18:31 PM  Confirm and document reason for call. If symptomatic, describe symptoms. You must click the next button to save text entered. ---Caller is c/o cough and chest congestion. She states she gets this every Spring. Denies any fever.  Has the patient traveled out of the country within the last 30 days? ---Not Applicable  Does the patient have any new or worsening symptoms? ---Yes  Will a triage be completed? ---Yes  Related visit to physician within the last 2 weeks? ---No  Does the PT have any chronic conditions? (i.e. diabetes, asthma, etc.) ---No  Is the patient pregnant or possibly pregnant? (Ask all females between the ages of 32-55) ---No  Is this a behavioral health or substance abuse call? ---No     Guidelines    Guideline Title Affirmed Question Affirmed Notes  Cough - Acute Non-Productive [1] Patient also has allergy symptoms (e.g., itchy eyes, clear nasal discharge, postnasal drip) AND [2] they are acting up    Final Disposition User   See PCP When Office is Open (within 3 days) Wayne Sever, RN, Tillie Rung    Comments  Scheduled with Dr Deborra Medina at 1145am tomorrow, 03/28   Referrals  REFERRED TO PCP OFFICE   Disagree/Comply: Leta Baptist

## 2015-08-19 NOTE — Telephone Encounter (Signed)
Pt has appt with Dr Deborra Medina on 08/20/15 at 11:45.

## 2015-08-20 ENCOUNTER — Ambulatory Visit (INDEPENDENT_AMBULATORY_CARE_PROVIDER_SITE_OTHER): Payer: BLUE CROSS/BLUE SHIELD | Admitting: Family Medicine

## 2015-08-20 ENCOUNTER — Encounter: Payer: Self-pay | Admitting: Family Medicine

## 2015-08-20 VITALS — BP 122/62 | HR 63 | Temp 98.1°F | Wt 209.2 lb

## 2015-08-20 DIAGNOSIS — J069 Acute upper respiratory infection, unspecified: Secondary | ICD-10-CM

## 2015-08-20 MED ORDER — BENZONATATE 200 MG PO CAPS: 200.0000 mg | ORAL_CAPSULE | Freq: Three times a day (TID) | ORAL | Status: DC | PRN

## 2015-08-20 NOTE — Progress Notes (Signed)
Pre visit review using our clinic review tool, if applicable. No additional management support is needed unless otherwise documented below in the visit note. 

## 2015-08-20 NOTE — Patient Instructions (Signed)
.   It sounds like an uncomplicated cold that we can treat at home.    Colds are very common and may make you feel uncomfortable.   Colds are caused by viruses, and no medicine or 'shot'will cure an uncomplicated cold.  FOR A STUFFY NOSE - USE NASAL WASHES:   Saline (salt water) nasal irrigation (nasal wash) is an effective and simple home remedy for treating stuffy nose and sinus congestion. The nose can be irrigated by pouring, spraying, or squirting salt water into the nose and then letting it run back out. * How it Helps: The salt water rinses out excess mucus, washes out any irritants (dust, allergens) that might be present, and moistens the nasal cavity. * Methods: There are several ways to perform nasal irrigation. You can use a saline nasal spray bottle (available over-the-counter), a rubber ear syringe, a medical syringe without the needle, or a NETI POT. STEP-BY-STEP INSTRUCTIONS:   STEP 1: Lean over a sink. STEP 2: Gently squirt or spray warm salt water into one of your nostrils.  STEP 3: Some of the water may run into the back of your throat. Spit this out. If you swallow the salt water it will not hurt you. STEP 4: Blow your nose to clean out the water and mucus.  STEP 5: Repeat steps 1-4 for the other nostril. You can do this a couple times a day if it seems to help you.   HOW TO MAKE SALINE (SALT WATER) NASAL WASH:   You can make your own saline nasal wash.  * Add 1/2 tsp of table salt to 1 cup (8 oz; 240 ml) of warm water. * You should use sterile, distilled, or previously boiled water for nasal irrigation.   FOR A RUNNY NOSE - BLOW YOUR NOSE:   If the skin around your nostrils gets irritated, apply a tiny amount of petroleum ointment to the nasal openings once or twice a day. MEDICINES FOR STUFFY OR RUNNY NOSE: If you have a very runny nose and you really think you need a medicine, you can try using a nasal decongestant for a couple days.   TREATMENT FOR ASSOCIATED SYMPTOMS  OF COLDS:   * Sore throat: throat lozenges, hard candy or warm chicken broth. * For muscle aches, headaches, or moderate fever (over 101 degrees F) (38.9 C) use acetaminophen every 4 hours.   Cough: use cough drops.   Hydrate: drink extra liquids. HUMIDIFIER: If the air in your home is dry, use a humidifier.   CONTAGIOUSNESS: * The cold virus is present in your nasal secretions. * Cover your nose and mouth with a tissue when you sneeze or cough. * Wash your hands frequently with soap and water. * You can return to work or school after the fever is gone and you feel well enough to participate in normal activities.  EXPECTED COURSE: * Fever 2-3 days * Nasal discharge 7-14 days * Cough 2-3 weeks. CALL BACK IF: * Fever lasts over 3 days * Runny nose lasts over 10 days * You become short of breath * You become worse  

## 2015-08-20 NOTE — Progress Notes (Signed)
SUBJECTIVE:  [name] Orr is a 45 y.o. female pt of Dr. Glori Bickers, new to me, who complains of coryza, congestion and sneezing for 4 days. She denies a history of anorexia, chest pain and fevers and denies a history of asthma. Patient denies smoke cigarettes.   Current Outpatient Prescriptions on File Prior to Visit  Medication Sig Dispense Refill  . cyclobenzaprine (FLEXERIL) 10 MG tablet Take 1 tablet (10 mg total) by mouth 3 (three) times daily as needed for muscle spasms. 30 tablet 1  . meloxicam (MOBIC) 7.5 MG tablet Take 1 tablet (7.5 mg total) by mouth daily. With food as needed for neck pain 30 tablet 1  . omeprazole (PRILOSEC) 20 MG capsule TAKE ONE CAPSULE BY MOUTH ONE TIME DAILY 30 capsule 11  . valACYclovir (VALTREX) 1000 MG tablet TAKE TWO TABLETS BY MOUTH TWICE DAILY FOR 1 DAY FOR FEVER BLISTERS  4 tablet 3   No current facility-administered medications on file prior to visit.    Allergies  Allergen Reactions  . Albuterol     Headaches  . Penicillins Hives    Past Medical History  Diagnosis Date  . Depression   . History of chicken pox   . Blood in stool     bright red each time has BM for atleast 6 mths  . Headache(784.0)   . Fainting spell   . Migraine   . Endometriosis   . Anemia     history  . GERD (gastroesophageal reflux disease)   . Chronic back pain greater than 3 months duration   . Anxiety     no meds    Past Surgical History  Procedure Laterality Date  . Back surgery  [phone]    Dr Glenna Fellows  . Laproscopy    . Knee surgery  2009    lateral realignment to lt knee  . Diagnostic laparoscopy      x 2  . Svd      x 1  . Dilation and curettage of uterus    . Colonscopy      Family History  Problem Relation Age of Onset  . Hyperlipidemia Mother   . Hypertension Mother   . Alcohol abuse Father   . Heart disease Father   . Stroke Father   . Kidney disease Father     Social History   Social History  . Marital Status: Single     Spouse Name: N/A  . Number of Children: N/A  . Years of Education: N/A   Occupational History  . Not on file.   Social History Main Topics  . Smoking status: Never Smoker   . Smokeless tobacco: Never Used  . Alcohol Use: 0.0 oz/week    0 Standard drinks or equivalent per week     Comment: 3-4 glasses of wine per month  . Drug Use: No  . Sexual Activity: Yes     Comment: Depo   Other Topics Concern  . Not on file   Social History Narrative   The PMH, PSH, Social History, Family History, Medications, and allergies have been reviewed in [facility], and have been updated if relevant.  OBJECTIVE: BP 122/62 mmHg  Pulse 63  Temp(Src) 98.1 F (36.7 C) (Oral)  Wt 209 lb 4 oz (94.915 kg)  SpO2 97%  LMP 10/07/2010  She appears well, vital signs are as noted. Ears normal.  Throat and pharynx normal.  Neck supple. No adenopathy in the neck. Nose is congested. Sinuses non tender. The  chest is clear, without wheezes or rales.  ASSESSMENT:  viral upper respiratory illness  PLAN: eRx sent for Tessalon perles to use as needed for cough.  Symptomatic therapy suggested: push fluids, rest and return office visit prn if symptoms persist or worsen. Lack of antibiotic effectiveness discussed with her. Call or return to clinic prn if these symptoms worsen or fail to improve as anticipated.

## 2015-09-13 ENCOUNTER — Encounter: Payer: Self-pay | Admitting: Emergency Medicine

## 2015-09-13 ENCOUNTER — Observation Stay
Admission: EM | Admit: 2015-09-13 | Discharge: 2015-09-13 | Disposition: A | Discharge status: Home / Self Care | Payer: BLUE CROSS/BLUE SHIELD | Attending: Internal Medicine | Admitting: Internal Medicine

## 2015-09-13 ENCOUNTER — Emergency Department: Payer: BLUE CROSS/BLUE SHIELD

## 2015-09-13 DIAGNOSIS — Z811 Family history of alcohol abuse and dependence: Secondary | ICD-10-CM | POA: Diagnosis not present

## 2015-09-13 DIAGNOSIS — N134 Hydroureter: Secondary | ICD-10-CM | POA: Diagnosis not present

## 2015-09-13 DIAGNOSIS — N132 Hydronephrosis with renal and ureteral calculous obstruction: Secondary | ICD-10-CM | POA: Diagnosis not present

## 2015-09-13 DIAGNOSIS — R42 Dizziness and giddiness: Secondary | ICD-10-CM | POA: Insufficient documentation

## 2015-09-13 DIAGNOSIS — N201 Calculus of ureter: Secondary | ICD-10-CM

## 2015-09-13 DIAGNOSIS — N809 Endometriosis, unspecified: Secondary | ICD-10-CM | POA: Insufficient documentation

## 2015-09-13 DIAGNOSIS — Z888 Allergy status to other drugs, medicaments and biological substances status: Secondary | ICD-10-CM | POA: Diagnosis not present

## 2015-09-13 DIAGNOSIS — G43509 Persistent migraine aura without cerebral infarction, not intractable, without status migrainosus: Secondary | ICD-10-CM | POA: Diagnosis not present

## 2015-09-13 DIAGNOSIS — G8929 Other chronic pain: Secondary | ICD-10-CM | POA: Insufficient documentation

## 2015-09-13 DIAGNOSIS — K219 Gastro-esophageal reflux disease without esophagitis: Secondary | ICD-10-CM | POA: Diagnosis not present

## 2015-09-13 DIAGNOSIS — Z8249 Family history of ischemic heart disease and other diseases of the circulatory system: Secondary | ICD-10-CM | POA: Diagnosis not present

## 2015-09-13 DIAGNOSIS — N200 Calculus of kidney: Secondary | ICD-10-CM | POA: Diagnosis not present

## 2015-09-13 DIAGNOSIS — Z880 Allergy status to penicillin: Secondary | ICD-10-CM | POA: Diagnosis not present

## 2015-09-13 DIAGNOSIS — Z79899 Other long term (current) drug therapy: Secondary | ICD-10-CM | POA: Diagnosis not present

## 2015-09-13 DIAGNOSIS — Z823 Family history of stroke: Secondary | ICD-10-CM | POA: Insufficient documentation

## 2015-09-13 DIAGNOSIS — F329 Major depressive disorder, single episode, unspecified: Secondary | ICD-10-CM | POA: Insufficient documentation

## 2015-09-13 DIAGNOSIS — R52 Pain, unspecified: Secondary | ICD-10-CM | POA: Diagnosis not present

## 2015-09-13 DIAGNOSIS — R0789 Other chest pain: Secondary | ICD-10-CM | POA: Insufficient documentation

## 2015-09-13 DIAGNOSIS — Z841 Family history of disorders of kidney and ureter: Secondary | ICD-10-CM | POA: Insufficient documentation

## 2015-09-13 DIAGNOSIS — R109 Unspecified abdominal pain: Secondary | ICD-10-CM

## 2015-09-13 DIAGNOSIS — F419 Anxiety disorder, unspecified: Secondary | ICD-10-CM | POA: Insufficient documentation

## 2015-09-13 DIAGNOSIS — R112 Nausea with vomiting, unspecified: Secondary | ICD-10-CM | POA: Insufficient documentation

## 2015-09-13 DIAGNOSIS — M542 Cervicalgia: Secondary | ICD-10-CM | POA: Diagnosis not present

## 2015-09-13 DIAGNOSIS — R1010 Upper abdominal pain, unspecified: Secondary | ICD-10-CM | POA: Diagnosis present

## 2015-09-13 LAB — URINALYSIS COMPLETE WITH MICROSCOPIC (ARMC ONLY)
BILIRUBIN URINE: NEGATIVE
Glucose, UA: NEGATIVE mg/dL
LEUKOCYTES UA: NEGATIVE
NITRITE: NEGATIVE
PH: 5 (ref 5.0–8.0)
Protein, ur: 100 mg/dL — AB
SPECIFIC GRAVITY, URINE: 1.03 (ref 1.005–1.030)

## 2015-09-13 LAB — COMPREHENSIVE METABOLIC PANEL
ALT: 12 U/L — ABNORMAL LOW (ref 14–54)
ANION GAP: 7 (ref 5–15)
AST: 20 U/L (ref 15–41)
Albumin: 4.3 g/dL (ref 3.5–5.0)
Alkaline Phosphatase: 38 U/L (ref 38–126)
BUN: 13 mg/dL (ref 6–20)
CHLORIDE: 107 mmol/L (ref 101–111)
CO2: 24 mmol/L (ref 22–32)
Calcium: 8.9 mg/dL (ref 8.9–10.3)
Creatinine, Ser: 0.84 mg/dL (ref 0.44–1.00)
Glucose, Bld: 134 mg/dL — ABNORMAL HIGH (ref 65–99)
POTASSIUM: 3.7 mmol/L (ref 3.5–5.1)
Sodium: 138 mmol/L (ref 135–145)
Total Bilirubin: 0.6 mg/dL (ref 0.3–1.2)
Total Protein: 7.3 g/dL (ref 6.5–8.1)

## 2015-09-13 LAB — LIPASE, BLOOD: LIPASE: 21 U/L (ref 11–51)

## 2015-09-13 LAB — CBC
HCT: 37.7 % (ref 35.0–47.0)
Hemoglobin: 12.9 g/dL (ref 12.0–16.0)
MCH: 29.2 pg (ref 26.0–34.0)
MCHC: 34.3 g/dL (ref 32.0–36.0)
MCV: 85.1 fL (ref 80.0–100.0)
PLATELETS: 282 10*3/uL (ref 150–440)
RBC: 4.43 MIL/uL (ref 3.80–5.20)
RDW: 13.5 % (ref 11.5–14.5)
WBC: 8.2 10*3/uL (ref 3.6–11.0)

## 2015-09-13 LAB — HEMOGLOBIN A1C: Hgb A1c MFr Bld: 5.4 % (ref 4.0–6.0)

## 2015-09-13 LAB — TSH: TSH: 2.82 u[IU]/mL (ref 0.350–4.500)

## 2015-09-13 MED ORDER — VALACYCLOVIR HCL 500 MG PO TABS
1000.0000 mg | ORAL_TABLET | Freq: Two times a day (BID) | ORAL | Status: DC | PRN
  Filled 2015-09-13: qty 2

## 2015-09-13 MED ORDER — BENZONATATE 100 MG PO CAPS: 200.0000 mg | ORAL_CAPSULE | Freq: Three times a day (TID) | ORAL | Status: DC | PRN

## 2015-09-13 MED ORDER — LORATADINE 10 MG PO TABS
10.0000 mg | ORAL_TABLET | Freq: Every day | ORAL | Status: DC
  Administered 2015-09-13: 10 mg via ORAL
  Filled 2015-09-13: qty 1

## 2015-09-13 MED ORDER — HYDROMORPHONE HCL 1 MG/ML IJ SOLN
INTRAMUSCULAR | Status: AC
  Administered 2015-09-13: 1 mg via INTRAVENOUS
  Filled 2015-09-13: qty 1

## 2015-09-13 MED ORDER — HYDROMORPHONE HCL 1 MG/ML IJ SOLN
1.0000 mg | Freq: Once | INTRAMUSCULAR | Status: AC
  Administered 2015-09-13: 1 mg via INTRAVENOUS

## 2015-09-13 MED ORDER — ONDANSETRON HCL 4 MG/2ML IJ SOLN
4.0000 mg | Freq: Once | INTRAMUSCULAR | Status: AC
  Administered 2015-09-13: 4 mg via INTRAVENOUS

## 2015-09-13 MED ORDER — KETOROLAC TROMETHAMINE 30 MG/ML IJ SOLN
30.0000 mg | Freq: Once | INTRAMUSCULAR | Status: AC
  Administered 2015-09-13: 30 mg via INTRAVENOUS

## 2015-09-13 MED ORDER — OXYCODONE HCL 5 MG PO TABS
5.0000 mg | ORAL_TABLET | Freq: Four times a day (QID) | ORAL | Status: DC | PRN
  Administered 2015-09-13: 5 mg via ORAL
  Filled 2015-09-13: qty 1

## 2015-09-13 MED ORDER — ENOXAPARIN SODIUM 40 MG/0.4ML ~~LOC~~ SOLN: 40.0000 mg | SUBCUTANEOUS | Status: DC

## 2015-09-13 MED ORDER — HYDROMORPHONE HCL 1 MG/ML IJ SOLN
1.0000 mg | INTRAMUSCULAR | Status: DC | PRN
  Administered 2015-09-13: 1 mg via INTRAVENOUS
  Filled 2015-09-13: qty 1

## 2015-09-13 MED ORDER — OXYCODONE HCL 5 MG PO TABS: 5.0000 mg | ORAL_TABLET | Freq: Four times a day (QID) | ORAL | Status: DC | PRN

## 2015-09-13 MED ORDER — ONDANSETRON HCL 4 MG/2ML IJ SOLN: 4.0000 mg | Freq: Four times a day (QID) | INTRAMUSCULAR | Status: DC | PRN

## 2015-09-13 MED ORDER — ONDANSETRON HCL 4 MG/2ML IJ SOLN
INTRAMUSCULAR | Status: AC
  Administered 2015-09-13: 4 mg via INTRAVENOUS
  Filled 2015-09-13: qty 2

## 2015-09-13 MED ORDER — ACETAMINOPHEN 650 MG RE SUPP: 650.0000 mg | Freq: Four times a day (QID) | RECTAL | Status: DC | PRN

## 2015-09-13 MED ORDER — PANTOPRAZOLE SODIUM 40 MG PO TBEC
40.0000 mg | DELAYED_RELEASE_TABLET | Freq: Every day | ORAL | Status: DC
  Administered 2015-09-13: 40 mg via ORAL
  Filled 2015-09-13: qty 1

## 2015-09-13 MED ORDER — ONDANSETRON HCL 4 MG PO TABS: 4.0000 mg | ORAL_TABLET | Freq: Four times a day (QID) | ORAL | Status: DC | PRN

## 2015-09-13 MED ORDER — HYDROMORPHONE HCL 1 MG/ML IJ SOLN: 1.0000 mg | Freq: Once | INTRAMUSCULAR | Status: DC

## 2015-09-13 MED ORDER — CYCLOBENZAPRINE HCL 10 MG PO TABS: 10.0000 mg | ORAL_TABLET | Freq: Three times a day (TID) | ORAL | Status: DC | PRN

## 2015-09-13 MED ORDER — HYDROMORPHONE HCL 1 MG/ML IJ SOLN
0.5000 mg | Freq: Once | INTRAMUSCULAR | Status: AC
  Administered 2015-09-13: 0.5 mg via INTRAVENOUS
  Filled 2015-09-13: qty 1

## 2015-09-13 MED ORDER — DOCUSATE SODIUM 100 MG PO CAPS
100.0000 mg | ORAL_CAPSULE | Freq: Two times a day (BID) | ORAL | Status: DC
  Administered 2015-09-13: 100 mg via ORAL
  Filled 2015-09-13: qty 1

## 2015-09-13 MED ORDER — ACETAMINOPHEN 325 MG PO TABS: 650.0000 mg | ORAL_TABLET | Freq: Four times a day (QID) | ORAL | Status: DC | PRN

## 2015-09-13 MED ORDER — HYDROMORPHONE HCL 1 MG/ML IJ SOLN
INTRAMUSCULAR | Status: AC
  Filled 2015-09-13: qty 1

## 2015-09-13 MED ORDER — SODIUM CHLORIDE 0.9 % IV SOLN
INTRAVENOUS | Status: DC
  Administered 2015-09-13: 1000 mL via INTRAVENOUS

## 2015-09-13 NOTE — ED Notes (Signed)
Dr. Marcille Blanco at bedside to admit the pt

## 2015-09-13 NOTE — ED Provider Notes (Signed)
[facility] Emergency Department Provider Note  ____________________________________________    I have reviewed the triage vital signs and the nursing notes.   HISTORY  Chief Complaint Abdominal Pain    HPI [name] Orr is a 45 y.o. female who presents with right flank pain. Patient reports relatively abrupt onset of severe stabbing right flank and right lower quadrant abdominal pain. She has never had this pain before. She denies fevers or chills. She denies dysuria. Positive nausea. Normal bowel movements. No history of kidney stones.     Past Medical History  Diagnosis Date  . Depression   . History of chicken pox   . Blood in stool     bright red each time has BM for atleast 6 mths  . Headache(784.0)   . Fainting spell   . Migraine   . Endometriosis   . Anemia     history  . GERD (gastroesophageal reflux disease)   . Chronic back pain greater than 3 months duration   . Anxiety     no meds    Patient Active Problem List   Diagnosis Date Noted  . Chest pain, atypical 01/10/2014  . Neck pain 01/10/2014  . Dizziness 01/10/2014  . Fever blister 07/06/2013  . Neck pain, bilateral 02/06/2013  . Depression 10/12/2010  . Migraine aura, persistent 10/12/2010  . Endometriosis 10/10/2010    Past Surgical History  Procedure Laterality Date  . Back surgery  [phone]    Dr Glenna Fellows  . Laproscopy    . Knee surgery  2009    lateral realignment to lt knee  . Diagnostic laparoscopy      x 2  . Svd      x 1  . Dilation and curettage of uterus    . Colonscopy      Current Outpatient Rx  Name  Route  Sig  Dispense  Refill  . benzonatate (TESSALON) 200 MG capsule   Oral   Take 1 capsule (200 mg total) by mouth 3 (three) times daily as needed for cough.   20 capsule   0   . cyclobenzaprine (FLEXERIL) 10 MG tablet   Oral   Take 1 tablet (10 mg total) by mouth 3 (three) times daily as needed for muscle spasms.   30 tablet   1   . loratadine (CLARITIN) 10 MG tablet   Oral   Take 10 mg by mouth daily.         Marland Kitchen omeprazole (PRILOSEC) 20 MG capsule   Oral   Take 20 mg by mouth daily as needed (heartburn).         . valACYclovir (VALTREX) 1000 MG tablet   Oral   Take 1,000 mg by mouth 2 (two) times daily as needed (fever blisters).         . meloxicam (MOBIC) 7.5 MG tablet   Oral   Take 1 tablet (7.5 mg total) by mouth daily. With food as needed for neck pain Patient not taking: Reported on 09/13/2015   30 tablet   1     Allergies Albuterol and Penicillins  Family History  Problem Relation Age of Onset  . Hyperlipidemia Mother   . Hypertension Mother   . Alcohol abuse Father   . Heart disease Father   . Stroke Father   . Kidney disease Father     Social History Social History  Substance Use Topics  . Smoking status: Never Smoker   . Smokeless tobacco: Never Used  .  Alcohol Use: 0.0 oz/week    0 Standard drinks or equivalent per week     Comment: 3-4 glasses of wine per month    Review of Systems  Constitutional: Negative for fever. Eyes: Negative for redness ENT: Negative for sore throat Cardiovascular: Negative for chest pain Respiratory: Negative for Cough Gastrointestinal: As above Genitourinary: Negative for dysuria.No hematuria Musculoskeletal: Negative for back pain. Skin: Negative for rash. Neurological: Negative for headache Psychiatric: no anxiety    ____________________________________________   PHYSICAL EXAM:  VITAL SIGNS: ED Triage Vitals  Enc Vitals Group     BP 09/13/15 0124 145/85 mmHg     Pulse Rate 09/13/15 0124 93     Resp 09/13/15 0124 22     Temp --      Temp src --      SpO2 09/13/15 0124 99 %     Weight 09/13/15 0124 200 lb (90.719 kg)     Height 09/13/15 0124 5\' 8"  (1.727 m)     Head Cir --      Peak Flow --      Pain Score 09/13/15 0123 10     Pain Loc --      Pain Edu? --      Excl. in [place]? --      Constitutional: Alert and  oriented. Obviously in pain, tearful Eyes: Conjunctivae are normal. No erythema or injection ENT   Head: Normocephalic and atraumatic.   Mouth/Throat: Mucous membranes are moist. Cardiovascular: Normal rate, regular rhythm. Respiratory: Normal respiratory effort without tachypnea nor retractions.  Gastrointestinal: Soft and non-tender in all quadrants. No distention. There is no CVA tenderness. Genitourinary: deferred Musculoskeletal: Nontender with normal range of motion in all extremities.  Neurologic:  Normal speech and language.  Skin:  Skin is warm, dry and intact. No rash noted. Psychiatric: Mood and affect are normal. Patient exhibits appropriate insight and judgment.  ____________________________________________    LABS (pertinent positives/negatives)  Labs Reviewed  COMPREHENSIVE METABOLIC PANEL - Abnormal; Notable for the following:    Glucose, Bld 134 (*)    ALT 12 (*)    All other components within normal limits  URINALYSIS COMPLETEWITH MICROSCOPIC ([facility] ONLY) - Abnormal; Notable for the following:    Color, Urine AMBER (*)    APPearance TURBID (*)    Ketones, ur TRACE (*)    Hgb urine dipstick 2+ (*)    Protein, ur 100 (*)    Bacteria, UA RARE (*)    Squamous Epithelial / LPF 6-30 (*)    All other components within normal limits  CBC  LIPASE, BLOOD    ____________________________________________   EKG  None  ____________________________________________    RADIOLOGY  CT shows 3 mm stone  ____________________________________________   PROCEDURES  Procedure(s) performed: none  Critical Care performed: none  ____________________________________________   INITIAL IMPRESSION / ASSESSMENT AND PLAN / ED COURSE  Pertinent labs & imaging results that were available during my care of the patient were reviewed by me and considered in my medical decision making (see chart for details).  Patient given Toradol without relief. IV Dilaudid  provided relief briefly, after second dose of Dilaudid patient seems to be far more comfortable. CT scan confirms kidney stone. No evidence of UTI, labs benign  Given severe pain discussed the case with urology on-call ----------------------------------------- 5:29 AM on 09/13/2015 ----------------------------------------- Patient remains pain-free.  ----------------------------------------- 5:39 AM on 09/13/2015 -----------------------------------------  Patient is now having pain again. I will admit her to the hospitalist service for pain  control and urology consultation   ____________________________________________   FINAL CLINICAL IMPRESSION(S) / ED DIAGNOSES  Final diagnoses:  Flank pain, acute  Ureterolithiasis          Lavonia Drafts, MD 09/13/15 [phone]

## 2015-09-13 NOTE — Progress Notes (Signed)
Patient discharge to home as ordered. Discharge orders given to patient as ordered. Patient instructed to keep follow up appointments as ordered. Patient is alert and oriented times 4, ambulates without assistance. Denies pain at this time. No acute distress noted.

## 2015-09-13 NOTE — ED Notes (Signed)
Pt to triage via w/c, appears uncomfortable, tearful; pt reports sudden onset of right lower abd pain, nonradiating accomp by nausea

## 2015-09-13 NOTE — H&P (Signed)
[name] Orr is an 45 y.o. female.   Chief Complaint: Abdominal pain HPI: The patient presents to the emergency department complaining of right-sided flank pain that began a few hours prior to presentation. She states that she awoke feeling nauseous and had 1 episode of nonbloody nonbilious emesis. The pain steadily intensified and she came to the emergency department for evaluation where she was found to have a 3 mm kidney stone. She received multiple doses of analgesic medication but continued to have pain and nausea which prompted the emergency department staff to call for admission.  Past Medical History  Diagnosis Date  . Depression   . History of chicken pox   . Blood in stool     bright red each time has BM for atleast 6 mths  . Headache(784.0)   . Fainting spell   . Migraine   . Endometriosis   . Anemia     history  . GERD (gastroesophageal reflux disease)   . Chronic back pain greater than 3 months duration   . Anxiety     no meds    Past Surgical History  Procedure Laterality Date  . Back surgery  [phone]    Dr Glenna Fellows  . Laproscopy    . Knee surgery  2009    lateral realignment to lt knee  . Diagnostic laparoscopy      x 2  . Svd      x 1  . Dilation and curettage of uterus    . Colonscopy      Family History  Problem Relation Age of Onset  . Hyperlipidemia Mother   . Hypertension Mother   . Alcohol abuse Father   . Heart disease Father   . Stroke Father   . Kidney disease Father    Social History:  reports that she has never smoked. She has never used smokeless tobacco. She reports that she drinks alcohol. She reports that she does not use illicit drugs.  Allergies:  Allergies  Allergen Reactions  . Albuterol Other (See Comments)    Headaches  . Penicillins Hives and Other (See Comments)    Has patient had a PCN reaction causing immediate rash, facial/tongue/throat swelling, SOB or lightheadedness with hypotension: Yes Has patient  had a PCN reaction causing severe rash involving mucus membranes or skin necrosis: No Has patient had a PCN reaction that required hospitalization No Has patient had a PCN reaction occurring within the last 10 years: No If all of the above answers are "NO", then may proceed with Cephalosporin use.      Prior to Admission medications   Medication Sig Start Date End Date Taking? Authorizing Provider  benzonatate (TESSALON) 200 MG capsule Take 1 capsule (200 mg total) by mouth 3 (three) times daily as needed for cough. 08/20/15  Yes Lucille Passy, MD  cyclobenzaprine (FLEXERIL) 10 MG tablet Take 1 tablet (10 mg total) by mouth 3 (three) times daily as needed for muscle spasms. 11/21/14  Yes Abner Greenspan, MD  loratadine (CLARITIN) 10 MG tablet Take 10 mg by mouth daily.   Yes Historical Provider, MD  omeprazole (PRILOSEC) 20 MG capsule Take 20 mg by mouth daily as needed (heartburn).   Yes Historical Provider, MD  valACYclovir (VALTREX) 1000 MG tablet Take 1,000 mg by mouth 2 (two) times daily as needed (fever blisters).   Yes Historical Provider, MD  meloxicam (MOBIC) 7.5 MG tablet Take 1 tablet (7.5 mg total) by mouth daily. With food as needed  for neck pain Patient not taking: Reported on 09/13/2015 11/21/14   Abner Greenspan, MD     Results for orders placed or performed during the hospital encounter of 09/13/15 (from the past 48 hour(s))  CBC     Status: None   Collection Time: 09/13/15  1:36 AM  Result Value Ref Range   WBC 8.2 3.6 - 11.0 K/uL   RBC 4.43 3.80 - 5.20 MIL/uL   Hemoglobin 12.9 12.0 - 16.0 g/dL   HCT 37.7 35.0 - 47.0 %   MCV 85.1 80.0 - 100.0 fL   MCH 29.2 26.0 - 34.0 pg   MCHC 34.3 32.0 - 36.0 g/dL   RDW 13.5 11.5 - 14.5 %   Platelets 282 150 - 440 K/uL  Comprehensive metabolic panel     Status: Abnormal   Collection Time: 09/13/15  1:36 AM  Result Value Ref Range   Sodium 138 135 - 145 mmol/L   Potassium 3.7 3.5 - 5.1 mmol/L   Chloride 107 101 - 111 mmol/L   CO2 24  22 - 32 mmol/L   Glucose, Bld 134 (H) 65 - 99 mg/dL   BUN 13 6 - 20 mg/dL   Creatinine, Ser 0.84 0.44 - 1.00 mg/dL   Calcium 8.9 8.9 - 10.3 mg/dL   Total Protein 7.3 6.5 - 8.1 g/dL   Albumin 4.3 3.5 - 5.0 g/dL   AST 20 15 - 41 U/L   ALT 12 (L) 14 - 54 U/L   Alkaline Phosphatase 38 38 - 126 U/L   Total Bilirubin 0.6 0.3 - 1.2 mg/dL   GFR calc non Af Amer >60 >60 mL/min   GFR calc Af Amer >60 >60 mL/min    Comment: (NOTE) The eGFR has been calculated using the CKD EPI equation. This calculation has not been validated in all clinical situations. eGFR's persistently <60 mL/min signify possible Chronic Kidney Disease.    Anion gap 7 5 - 15  Lipase, blood     Status: None   Collection Time: 09/13/15  1:36 AM  Result Value Ref Range   Lipase 21 11 - 51 U/L  Urinalysis complete, with microscopic ([facility] only)     Status: Abnormal   Collection Time: 09/13/15  3:36 AM  Result Value Ref Range   Color, Urine AMBER (A) YELLOW   APPearance TURBID (A) CLEAR   Glucose, UA NEGATIVE NEGATIVE mg/dL   Bilirubin Urine NEGATIVE NEGATIVE   Ketones, ur TRACE (A) NEGATIVE mg/dL   Specific Gravity, Urine 1.030 1.005 - 1.030   Hgb urine dipstick 2+ (A) NEGATIVE   pH 5.0 5.0 - 8.0   Protein, ur 100 (A) NEGATIVE mg/dL   Nitrite NEGATIVE NEGATIVE   Leukocytes, UA NEGATIVE NEGATIVE   RBC / HPF 6-30 0 - 5 RBC/hpf   WBC, UA 0-5 0 - 5 WBC/hpf   Bacteria, UA RARE (A) NONE SEEN   Squamous Epithelial / LPF 6-30 (A) NONE SEEN   Mucous PRESENT    Ct Renal Stone Study  09/13/2015  CLINICAL DATA:  Sudden onset right lower abdominal pain with nausea. EXAM: CT ABDOMEN AND PELVIS WITHOUT CONTRAST TECHNIQUE: Multidetector CT imaging of the abdomen and pelvis was performed following the standard protocol without IV contrast. COMPARISON:  None. FINDINGS: There is a 3 mm stone in the distal right ureter at the ureterovesical junction. Multiple bilateral intrarenal stones. Mild hydronephrosis and hydroureter on the right  with stranding around the right ureter and kidney. No hydronephrosis or hydroureter on the  left. Bladder is decompressed. Mildly increased density demonstrated throughout the liver. This is nonspecific but could indicate iron overload. Laboratory correlation suggested. The unenhanced appearance of the gallbladder, spleen, pancreas, adrenal glands, abdominal aorta, inferior vena cava, and retroperitoneal lymph nodes is unremarkable. Stomach, small bowel, and colon are not abnormally distended. Stool fills the colon. No free air or free fluid in the abdomen. Abdominal wall musculature appears intact. Pelvis: The appendix is not identified. Uterus is surgically absent. No free or loculated pelvic fluid collections. No pelvic mass or lymphadenopathy. No destructive bone lesions. IMPRESSION: 3 mm stone in the distal right ureter with moderate proximal obstruction. Bilateral nonobstructing intrarenal stones. Electronically Signed   By: Lucienne Capers M.D.   On: 09/13/2015 02:37    Review of Systems  Constitutional: Negative for fever and chills.  HENT: Negative for sore throat and tinnitus.   Eyes: Negative for blurred vision and redness.  Respiratory: Negative for cough and shortness of breath.   Cardiovascular: Negative for chest pain, palpitations, orthopnea and PND.  Gastrointestinal: Positive for nausea and vomiting. Negative for abdominal pain and diarrhea.  Genitourinary: Positive for flank pain. Negative for dysuria, urgency and frequency.  Musculoskeletal: Negative for myalgias and joint pain.  Skin: Negative for rash.       No lesions  Neurological: Negative for speech change, focal weakness and weakness.  Endo/Heme/Allergies: Does not bruise/bleed easily.       No temperature intolerance  Psychiatric/Behavioral: Negative for depression and suicidal ideas.    Blood pressure 138/90, pulse 92, resp. rate 16, height 5' 8"  (1.727 m), weight 90.719 kg (200 lb), last menstrual period 10/07/2010,  SpO2 98 %. Physical Exam  Vitals reviewed. Constitutional: She is oriented to person, place, and time. She appears well-developed and well-nourished.  HENT:  Head: Normocephalic and atraumatic.  Mouth/Throat: Oropharynx is clear and moist.  Eyes: Conjunctivae and EOM are normal. Pupils are equal, round, and reactive to light. No scleral icterus.  Neck: Normal range of motion. Neck supple. No JVD present. No tracheal deviation present. No thyromegaly present.  Cardiovascular: Normal rate, regular rhythm and normal heart sounds.  Exam reveals no gallop and no friction rub.   No murmur heard. Respiratory: Effort normal and breath sounds normal.  GI: Soft. Bowel sounds are normal. She exhibits no distension. There is no tenderness.  Genitourinary:  Deferred  Musculoskeletal: Normal range of motion. She exhibits no edema.  Lymphadenopathy:    She has no cervical adenopathy.  Neurological: She is alert and oriented to person, place, and time. No cranial nerve deficit. She exhibits normal muscle tone.  Skin: Skin is warm and dry.  Psychiatric: She has a normal mood and affect. Her behavior is normal. Judgment and thought content normal.     Assessment/Plan This is a 45 year old female admitted for intractable pain. 1. Intractable pain: Secondary to kidney stone. I place the patient on a combination of oral and IV analgesic medication. Manage pain as best as possible. Anti-emetics for nausea. 2. Kidney stones: Aggressively hydrate with intravenous fluid; encourage increase in dietary calcium and acidic foods while limiting oxalate. 3. DVT prophylaxis: Lovenox 4. GI prophylaxis: Continue H2 blocker per home regimen The patient is a full code. Time spent on admission was inpatient care approximately 45 minutes  Harrie Foreman, MD 09/13/2015, 5:56 AM

## 2015-09-13 NOTE — Consult Note (Signed)
52F with 2.53mm stone within the intramural bladder (bladder wall).  Severe pain, difficult to control.  No evidence of infection. Spoke with ED attending and suggested medical explusion therapy (flomax, oxycodone, zofran, strainer) given size and location of stone.  If she requires admission for IV pain medications, I recommend that the patient be admitted through the hospitalist service.  Ok to treat with toradol.  F/u with urology as outpatient (I have sent a request.) within the next 7-10 days with KUB prior.

## 2015-09-13 NOTE — Discharge Summary (Signed)
[facility] NAME: [name] Orr    MR#:  [id number]  DATE OF BIRTH:  [birth year]  DATE OF ADMISSION:  09/13/2015 ADMITTING PHYSICIAN: Harrie Foreman, MD  DATE OF DISCHARGE: 09/13/2015 PRIMARY CARE PHYSICIAN: Loura Pardon, MD    ADMISSION DIAGNOSIS:  Ureterolithiasis [N20.1] Flank pain, acute [R10.10]   DISCHARGE DIAGNOSIS:   Nephrolithiasis with Intractable pain SECONDARY DIAGNOSIS:   Past Medical History  Diagnosis Date  . Depression   . History of chicken pox   . Blood in stool     bright red each time has BM for atleast 6 mths  . Headache(784.0)   . Fainting spell   . Migraine   . Endometriosis   . Anemia     history  . GERD (gastroesophageal reflux disease)   . Chronic back pain greater than 3 months duration   . Anxiety     no meds    HOSPITAL COURSE:  This is a 45 year old female admitted for intractable pain.  Nephrolithiasis with Intractable pain: She has been treated with IV fluid support, ral and IV analgesic medication.  Anti-emetics for nausea. She was encouraged increase in dietary calcium and acidic foods while limiting oxalate.  Her symptoms has much improved. Follow-up the urologist as outpatient.   DISCHARGE CONDITIONS:   Stable, discharge to home today.  CONSULTS OBTAINED:     DRUG ALLERGIES:   Allergies  Allergen Reactions  . Albuterol Other (See Comments)    Headaches  . Penicillins Hives and Other (See Comments)    Has patient had a PCN reaction causing immediate rash, facial/tongue/throat swelling, SOB or lightheadedness with hypotension: Yes Has patient had a PCN reaction causing severe rash involving mucus membranes or skin necrosis: No Has patient had a PCN reaction that required hospitalization No Has patient had a PCN reaction occurring within the last 10 years: No If all of the above answers are "NO", then may proceed with Cephalosporin use.      DISCHARGE MEDICATIONS:   Current Discharge Medication List    START taking these medications   Details  oxyCODONE (OXY IR/ROXICODONE) 5 MG immediate release tablet Take 1 tablet (5 mg total) by mouth every 6 (six) hours as needed for breakthrough pain. Qty: 10 tablet, Refills: 0      CONTINUE these medications which have NOT CHANGED   Details  benzonatate (TESSALON) 200 MG capsule Take 1 capsule (200 mg total) by mouth 3 (three) times daily as needed for cough. Qty: 20 capsule, Refills: 0    cyclobenzaprine (FLEXERIL) 10 MG tablet Take 1 tablet (10 mg total) by mouth 3 (three) times daily as needed for muscle spasms. Qty: 30 tablet, Refills: 1    loratadine (CLARITIN) 10 MG tablet Take 10 mg by mouth daily.    omeprazole (PRILOSEC) 20 MG capsule Take 20 mg by mouth daily as needed (heartburn).    valACYclovir (VALTREX) 1000 MG tablet Take 1,000 mg by mouth 2 (two) times daily as needed (fever blisters).    meloxicam (MOBIC) 7.5 MG tablet Take 1 tablet (7.5 mg total) by mouth daily. With food as needed for neck pain Qty: 30 tablet, Refills: 1         DISCHARGE INSTRUCTIONS:   If you experience worsening of your admission symptoms, develop shortness of breath, life threatening emergency, suicidal or homicidal thoughts you must seek medical attention immediately by calling 911 or calling your MD immediately  if symptoms less severe.  You Must read complete instructions/literature along with all the possible adverse reactions/side effects for all the Medicines you take and that have been prescribed to you. Take any new Medicines after you have completely understood and accept all the possible adverse reactions/side effects.   Please note  You were cared for by a hospitalist during your hospital stay. If you have any questions about your discharge medications or the care you received while you were in the hospital after you are discharged, you can call the unit and asked to  speak with the hospitalist on call if the hospitalist that took care of you is not available. Once you are discharged, your primary care physician will handle any further medical issues. Please note that NO REFILLS for any discharge medications will be authorized once you are discharged, as it is imperative that you return to your primary care physician (or establish a relationship with a primary care physician if you do not have one) for your aftercare needs so that they can reassess your need for medications and monitor your lab values.    Today   SUBJECTIVE    Abdominal pain is better. No nausea vomiting or diarrhea.  VITAL SIGNS:  Blood pressure 100/52, pulse 75, temperature 98.3 F (36.8 C), temperature source Oral, resp. rate 16, height 5\' 8"  (1.727 m), weight 91.5 kg (201 lb 11.5 oz), last menstrual period 10/07/2010, SpO2 99 %.  I/O:   Intake/Output Summary (Last 24 hours) at 09/13/15 1532 Last data filed at 09/13/15 1130  Gross per 24 hour  Intake      0 ml  Output      0 ml  Net      0 ml    PHYSICAL EXAMINATION:  GENERAL:  45 y.o.-year-old patient lying in the bed with no acute distress. Obese. EYES: Pupils equal, round, reactive to light and accommodation. No scleral icterus. Extraocular muscles intact.  HEENT: Head atraumatic, normocephalic. Oropharynx and nasopharynx clear.  NECK:  Supple, no jugular venous distention. No thyroid enlargement, no tenderness.  LUNGS: Normal breath sounds bilaterally, no wheezing, rales,rhonchi or crepitation. No use of accessory muscles of respiration.  CARDIOVASCULAR: S1, S2 normal. No murmurs, rubs, or gallops.  ABDOMEN: Soft, non-tender, non-distended. Bowel sounds present. No organomegaly or mass.  EXTREMITIES: No pedal edema, cyanosis, or clubbing.  NEUROLOGIC: Cranial nerves II through XII are intact. Muscle strength 5/5 in all extremities. Sensation intact. Gait not checked.  PSYCHIATRIC: The patient is alert and oriented x 3.   SKIN: No obvious rash, lesion, or ulcer.   DATA REVIEW:   CBC  Recent Labs Lab 09/13/15 0136  WBC 8.2  HGB 12.9  HCT 37.7  PLT 282    Chemistries   Recent Labs Lab 09/13/15 0136  NA 138  K 3.7  CL 107  CO2 24  GLUCOSE 134*  BUN 13  CREATININE 0.84  CALCIUM 8.9  AST 20  ALT 12*  ALKPHOS 38  BILITOT 0.6    Cardiac Enzymes No results for input(s): TROPONINI in the last 168 hours.  Microbiology Results  Results for orders placed or performed during the hospital encounter of 02/26/11  Surgical pcr screen     Status: None   Collection Time: 02/26/11  9:59 AM  Result Value Ref Range Status   MRSA, PCR NEGATIVE NEGATIVE Final   Staphylococcus aureus NEGATIVE NEGATIVE Final    Comment:        The Xpert SA Assay (FDA approved for NASAL specimens only), is one  component of a comprehensive surveillance program.  It is not intended to diagnose infection nor to guide or monitor treatment.    RADIOLOGY:  Ct Renal Stone Study  09/13/2015  CLINICAL DATA:  Sudden onset right lower abdominal pain with nausea. EXAM: CT ABDOMEN AND PELVIS WITHOUT CONTRAST TECHNIQUE: Multidetector CT imaging of the abdomen and pelvis was performed following the standard protocol without IV contrast. COMPARISON:  None. FINDINGS: There is a 3 mm stone in the distal right ureter at the ureterovesical junction. Multiple bilateral intrarenal stones. Mild hydronephrosis and hydroureter on the right with stranding around the right ureter and kidney. No hydronephrosis or hydroureter on the left. Bladder is decompressed. Mildly increased density demonstrated throughout the liver. This is nonspecific but could indicate iron overload. Laboratory correlation suggested. The unenhanced appearance of the gallbladder, spleen, pancreas, adrenal glands, abdominal aorta, inferior vena cava, and retroperitoneal lymph nodes is unremarkable. Stomach, small bowel, and colon are not abnormally distended. Stool fills the  colon. No free air or free fluid in the abdomen. Abdominal wall musculature appears intact. Pelvis: The appendix is not identified. Uterus is surgically absent. No free or loculated pelvic fluid collections. No pelvic mass or lymphadenopathy. No destructive bone lesions. IMPRESSION: 3 mm stone in the distal right ureter with moderate proximal obstruction. Bilateral nonobstructing intrarenal stones. Electronically Signed   By: Lucienne Capers M.D.   On: 09/13/2015 02:37        Management plans discussed with the patient, her mother and daughter and they are in agreement.  CODE STATUS:     Code Status Orders        Start     Ordered   09/13/15 0735  Full code   Continuous     09/13/15 0734    Code Status History    Date Active Date Inactive Code Status Order ID Comments User Context   03/05/2011 11:50 AM 03/06/2011  5:24 PM Full Code [id number]  Raynelle Bring, RN Inpatient      TOTAL TIME TAKING CARE OF THIS PATIENT: 26 minutes.    Demetrios Loll M.D on 09/13/2015 at 3:32 PM  Between 7am to 6pm - Pager - [phone]  After 6pm go to [email or web address] - password [email or web address] [facility]  [facility]  Office  [phone]  CC: Primary care physician; Loura Pardon, MD

## 2015-09-13 NOTE — Discharge Instructions (Signed)
Heart healthy diet. °Activity as tolerated. °

## 2015-09-14 DIAGNOSIS — N200 Calculus of kidney: Secondary | ICD-10-CM | POA: Diagnosis not present

## 2015-09-16 ENCOUNTER — Telehealth: Payer: Self-pay

## 2015-09-16 DIAGNOSIS — N200 Calculus of kidney: Secondary | ICD-10-CM

## 2015-09-16 NOTE — Telephone Encounter (Signed)
Patient has an appointment on 09-30-15 she needs an order for a KUB prior to this appt. Can you put the order in please and thank you   Sharyn Lull

## 2015-09-17 ENCOUNTER — Encounter: Payer: Self-pay | Admitting: Family Medicine

## 2015-09-17 ENCOUNTER — Ambulatory Visit: Payer: Self-pay

## 2015-09-17 ENCOUNTER — Telehealth: Payer: Self-pay | Admitting: Family Medicine

## 2015-09-17 ENCOUNTER — Ambulatory Visit (INDEPENDENT_AMBULATORY_CARE_PROVIDER_SITE_OTHER): Payer: BLUE CROSS/BLUE SHIELD | Admitting: Family Medicine

## 2015-09-17 VITALS — BP 104/80 | HR 55 | Temp 98.1°F | Wt 208.2 lb

## 2015-09-17 DIAGNOSIS — N200 Calculus of kidney: Secondary | ICD-10-CM | POA: Diagnosis not present

## 2015-09-17 LAB — POC URINALSYSI DIPSTICK (AUTOMATED)
BILIRUBIN UA: NEGATIVE
Blood, UA: NEGATIVE
Glucose, UA: NEGATIVE
KETONES UA: NEGATIVE
LEUKOCYTES UA: NEGATIVE
Nitrite, UA: NEGATIVE
PH UA: 6.5
Protein, UA: NEGATIVE
Spec Grav, UA: 1.02
Urobilinogen, UA: 4

## 2015-09-17 MED ORDER — OXYCODONE HCL 5 MG PO TABS: 5.0000 mg | ORAL_TABLET | Freq: Four times a day (QID) | ORAL | Status: DC | PRN

## 2015-09-17 MED ORDER — TAMSULOSIN HCL 0.4 MG PO CAPS: 0.4000 mg | ORAL_CAPSULE | Freq: Every day | ORAL | Status: DC

## 2015-09-17 NOTE — Telephone Encounter (Signed)
[facility] Patient Name: [name] Orr AN DOB: 02-19-1971 Initial Comment Caller states she had kidney stones, she passed them. Now she's having pain in her rib cage. going into her back. She was told she had stones in both kidneys. Nurse Assessment Nurse: Dimas Chyle, RN, Dellis Filbert Date/Time Eilene Ghazi Time): 09/17/2015 10:40:12 AM Confirm and document reason for call. If symptomatic, describe symptoms. You must click the next button to save text entered. ---Caller states she had kidney stones, she passed them. Now she's having pain in her rib cage. going into her back. She was told she had stones in both kidneys. Was in ED over the weekend and diagnosed with bilateral kidney stones. Has the patient traveled out of the country within the last 30 days? ---No Does the patient have any new or worsening symptoms? ---Yes Will a triage be completed? ---Yes Related visit to physician within the last 2 weeks? ---Yes Does the PT have any chronic conditions? (i.e. diabetes, asthma, etc.) ---No Is the patient pregnant or possibly pregnant? (Ask all females between the ages of 90-55) ---No Is this a behavioral health or substance abuse call? ---No Guidelines Guideline Title Affirmed Question Affirmed Notes Flank Pain MODERATE pain (e.g., interferes with normal activities or awakens from sleep) Final Disposition User See Physician within 24 Hours Dimas Chyle, RN, [employer] Referrals REFERRED TO PCP OFFICE Disagree/Comply: Leta Baptist

## 2015-09-17 NOTE — Telephone Encounter (Signed)
Order placed

## 2015-09-17 NOTE — Progress Notes (Signed)
Pre visit review using our clinic review tool, if applicable. No additional management support is needed unless otherwise documented below in the visit note.  She has known stones on CT.  She had a lot of lower abdominal pain prev.  It was a migratory pain, down the R flank initially.  D/w pt.  She has passed a stone in the meantime.  That abd pain is resolved in the meantime. Now with a new, different L sided pain.  It moves from front to back then back to the front on the L side of her back and upper abd.  Doesn't go to the groin like the prev renal stone pain.  Going on for about 3 days.  No FCVD now.  Some nausea prev and now.  No dysuria now.  Normal BMs.  On miralax for constipation.  Hasn't used opiates in the last few days.  She hasn't had this exact kind of pain prev.  She has had 3 back surgeries prev but this doesn't feel like her prev back pain. She doesn't drink etoh frequently at all, only rare use.   Meds, vitals, and allergies reviewed.   ROS: See HPI.  Otherwise, noncontributory.  GEN: nad, alert and oriented HEENT: mucous membranes moist NECK: supple w/o LA CV: rrr. PULM: ctab, no inc wob ABD: soft, +bs, not ttp except for L upper quadrant and L mid back ttp but not true CVA pain EXT: no edema SKIN: no acute rash

## 2015-09-17 NOTE — Patient Instructions (Signed)
We'll contact you with your lab report. Use flomax and oxycodone in the meantime and update [place] as needed.  Keep the urology appointment.

## 2015-09-17 NOTE — Telephone Encounter (Signed)
Pt has appt on 09/17/15 at 2 pm with Dr Damita Dunnings.

## 2015-09-18 ENCOUNTER — Emergency Department (HOSPITAL_COMMUNITY)
Admission: EM | Admit: 2015-09-18 | Discharge: 2015-09-18 | Disposition: A | Discharge status: Home / Self Care | Payer: BLUE CROSS/BLUE SHIELD | Attending: Emergency Medicine | Admitting: Emergency Medicine

## 2015-09-18 ENCOUNTER — Emergency Department (HOSPITAL_COMMUNITY): Payer: BLUE CROSS/BLUE SHIELD

## 2015-09-18 ENCOUNTER — Ambulatory Visit: Payer: Self-pay | Admitting: Primary Care

## 2015-09-18 DIAGNOSIS — K219 Gastro-esophageal reflux disease without esophagitis: Secondary | ICD-10-CM | POA: Insufficient documentation

## 2015-09-18 DIAGNOSIS — Z8659 Personal history of other mental and behavioral disorders: Secondary | ICD-10-CM | POA: Diagnosis not present

## 2015-09-18 DIAGNOSIS — M549 Dorsalgia, unspecified: Secondary | ICD-10-CM

## 2015-09-18 DIAGNOSIS — G8929 Other chronic pain: Secondary | ICD-10-CM | POA: Diagnosis not present

## 2015-09-18 DIAGNOSIS — G43909 Migraine, unspecified, not intractable, without status migrainosus: Secondary | ICD-10-CM | POA: Insufficient documentation

## 2015-09-18 DIAGNOSIS — Z8742 Personal history of other diseases of the female genital tract: Secondary | ICD-10-CM | POA: Diagnosis not present

## 2015-09-18 DIAGNOSIS — Z880 Allergy status to penicillin: Secondary | ICD-10-CM | POA: Insufficient documentation

## 2015-09-18 DIAGNOSIS — Z862 Personal history of diseases of the blood and blood-forming organs and certain disorders involving the immune mechanism: Secondary | ICD-10-CM | POA: Diagnosis not present

## 2015-09-18 DIAGNOSIS — N200 Calculus of kidney: Secondary | ICD-10-CM | POA: Insufficient documentation

## 2015-09-18 DIAGNOSIS — R109 Unspecified abdominal pain: Secondary | ICD-10-CM

## 2015-09-18 DIAGNOSIS — R5383 Other fatigue: Secondary | ICD-10-CM | POA: Diagnosis not present

## 2015-09-18 DIAGNOSIS — Z79899 Other long term (current) drug therapy: Secondary | ICD-10-CM | POA: Insufficient documentation

## 2015-09-18 DIAGNOSIS — Z8619 Personal history of other infectious and parasitic diseases: Secondary | ICD-10-CM | POA: Diagnosis not present

## 2015-09-18 LAB — CBC WITH DIFFERENTIAL/PLATELET
Basophils Absolute: 0 10*3/uL (ref 0.0–0.1)
Basophils Relative: 0 %
Eosinophils Absolute: 0.1 10*3/uL (ref 0.0–0.7)
Eosinophils Relative: 2 %
HEMATOCRIT: 33.3 % — AB (ref 36.0–46.0)
Hemoglobin: 11.5 g/dL — ABNORMAL LOW (ref 12.0–15.0)
LYMPHS ABS: 1.1 10*3/uL (ref 0.7–4.0)
LYMPHS PCT: 22 %
MCH: 29.9 pg (ref 26.0–34.0)
MCHC: 34.5 g/dL (ref 30.0–36.0)
MCV: 86.7 fL (ref 78.0–100.0)
Monocytes Absolute: 0.3 10*3/uL (ref 0.1–1.0)
Monocytes Relative: 5 %
NEUTROS ABS: 3.4 10*3/uL (ref 1.7–7.7)
NEUTROS PCT: 71 %
PLATELETS: 220 10*3/uL (ref 150–400)
RBC: 3.84 MIL/uL — AB (ref 3.87–5.11)
RDW: 12.9 % (ref 11.5–15.5)
WBC: 4.8 10*3/uL (ref 4.0–10.5)

## 2015-09-18 LAB — BASIC METABOLIC PANEL
Anion gap: 9 (ref 5–15)
BUN: 12 mg/dL (ref 6–20)
CALCIUM: 9.3 mg/dL (ref 8.9–10.3)
CO2: 24 mmol/L (ref 22–32)
CREATININE: 0.77 mg/dL (ref 0.44–1.00)
Chloride: 106 mmol/L (ref 101–111)
GFR calc Af Amer: >60 mL/min (ref >60)
GFR calc non Af Amer: >60 mL/min (ref >60)
GLUCOSE: 105 mg/dL — AB (ref 65–99)
Potassium: 3.9 mmol/L (ref 3.5–5.1)
Sodium: 139 mmol/L (ref 135–145)

## 2015-09-18 LAB — URINALYSIS, ROUTINE W REFLEX MICROSCOPIC
BILIRUBIN URINE: NEGATIVE
Glucose, UA: NEGATIVE mg/dL
Hgb urine dipstick: NEGATIVE
KETONES UR: NEGATIVE mg/dL
Leukocytes, UA: NEGATIVE
Nitrite: NEGATIVE
PH: 6.5 (ref 5.0–8.0)
Protein, ur: NEGATIVE mg/dL
Specific Gravity, Urine: 1.008 (ref 1.005–1.030)

## 2015-09-18 MED ORDER — SODIUM CHLORIDE 0.9 % IV BOLUS (SEPSIS)
1000.0000 mL | Freq: Once | INTRAVENOUS | Status: AC
  Administered 2015-09-18: 1000 mL via INTRAVENOUS

## 2015-09-18 MED ORDER — NAPROXEN 375 MG PO TABS: 375.0000 mg | ORAL_TABLET | Freq: Two times a day (BID) | ORAL | Status: DC

## 2015-09-18 MED ORDER — OXYCODONE-ACETAMINOPHEN 5-325 MG PO TABS
1.0000 | ORAL_TABLET | Freq: Once | ORAL | Status: AC
  Administered 2015-09-18: 2 via ORAL
  Filled 2015-09-18: qty 2

## 2015-09-18 MED ORDER — LORAZEPAM 2 MG/ML IJ SOLN
0.5000 mg | Freq: Once | INTRAMUSCULAR | Status: AC
  Administered 2015-09-18: 0.5 mg via INTRAVENOUS
  Filled 2015-09-18: qty 1

## 2015-09-18 MED ORDER — ONDANSETRON HCL 4 MG PO TABS: 4.0000 mg | ORAL_TABLET | Freq: Four times a day (QID) | ORAL | Status: DC

## 2015-09-18 MED ORDER — CYCLOBENZAPRINE HCL 10 MG PO TABS: 10.0000 mg | ORAL_TABLET | Freq: Three times a day (TID) | ORAL | Status: DC | PRN

## 2015-09-18 NOTE — ED Notes (Signed)
Bed: [facility] Expected date:  Expected time:  Means of arrival:  Comments: 45 yo M  Flank pain

## 2015-09-18 NOTE — ED Provider Notes (Signed)
Sign out from Delos Haring, PA-C Seen on 4/21 @ [facility] for R flank; CT scan passing 77mm stone on R side. Patient has passed R stone into a strainer over the past few days. L flank pain X 3 days with radiation to groin; pain bad this evening; no hydronephrosis or obstruction, renal U/S negative; saw PCP yesterday and had same bloodwork as today; Ativan and fentanyl; plan to discharge zofran, pain meds, referal to neurology pending negative CBC and CXR.  7:00am- Patient asked for pain medication, Percocet given. 9:00am- Patient's pain improved with Percocet. Patient also evaluated by Dr. Lacinda Axon who would like to treat the patient for a musculoskeletal etiology as well. Patient is tender on palpation of left lower rib. CBC shows mild anemia, hemoglobin 11.5. Patient has a history of anemia and this improved from past results. BMP unremarkable. UA unremarkable. CXR negative. US Renal shows left nephrolithiasis. Patient discharged home with Flexeril and Naprosyn with close follow-up with her PCP and a scheduled urology appointment the second week of May. Patient to continue taking Flomax and straining her urine until she sees urology.    Frederica Kuster, PA-C 09/18/15 [place], PA-C 09/18/15 [place], MD 10/02/15 [phone]

## 2015-09-18 NOTE — ED Provider Notes (Signed)
CSN: [id number]     Arrival date & time 09/18/15  [id number] History   First MD Initiated Contact with Patient 09/18/15 0330     Chief Complaint  Patient presents with  . Flank Pain     (Consider location/radiation/quality/duration/timing/severity/associated sxs/prior Treatment) HPI   Patient has a past medical history of depression, kidney stones, migraines, fainting spells, anemia, chronic back pain, anxiety. She comes in by EMS with complaints of left-sided flank pain that radiates to her groin. She was seen at [facility] on April 21 for pain to the right flank Pain and was diagnosed with a 3 mm nonobstructing stone. Her pain was unable to be controlled and she was admitted. The pain that she has today started this past Sunday. She's had decreased appetite but denies having any nausea, vomiting, diarrhea fevers or dysuria. EMS gave her 100 mcg of Fentanyl and rates her pain at 2/10 on arrival to the ER. Otherwise she appears stable with normal vital signs.  Past Medical History  Diagnosis Date  . Depression   . History of chicken pox   . Blood in stool     bright red each time has BM for atleast 6 mths  . Headache(784.0)   . Fainting spell   . Migraine   . Endometriosis   . Anemia     history  . GERD (gastroesophageal reflux disease)   . Chronic back pain greater than 3 months duration   . Anxiety     no meds   Past Surgical History  Procedure Laterality Date  . Back surgery  [phone]    Dr Glenna Fellows  . Laproscopy    . Knee surgery  2009    lateral realignment to lt knee  . Diagnostic laparoscopy      x 2  . Svd      x 1  . Dilation and curettage of uterus    . Colonscopy     Family History  Problem Relation Age of Onset  . Hyperlipidemia Mother   . Hypertension Mother   . Alcohol abuse Father   . Heart disease Father   . Stroke Father   . Kidney disease Father    Social History  Substance Use Topics  . Smoking status: Never Smoker   . Smokeless  tobacco: Never Used  . Alcohol Use: 0.0 oz/week    0 Standard drinks or equivalent per week     Comment: 3-4 glasses of wine per month   OB History    No data available     Review of Systems  Review of Systems All other systems negative except as documented in the HPI. All pertinent positives and negatives as reviewed in the HPI.   Allergies  Albuterol and Penicillins  Home Medications   Prior to Admission medications   Medication Sig Start Date End Date Taking? Authorizing Provider  cyclobenzaprine (FLEXERIL) 10 MG tablet Take 1 tablet (10 mg total) by mouth 3 (three) times daily as needed for muscle spasms. 11/21/14  Yes Abner Greenspan, MD  loratadine (CLARITIN) 10 MG tablet Take 10 mg by mouth daily.   Yes Historical Provider, MD  omeprazole (PRILOSEC) 20 MG capsule Take 20 mg by mouth daily as needed (heartburn).   Yes Historical Provider, MD  polyethylene glycol (MIRALAX / GLYCOLAX) packet Take 17 g by mouth daily as needed for mild constipation or moderate constipation.    Yes Historical Provider, MD  valACYclovir (VALTREX) 1000 MG tablet Take 1,000 mg by  mouth 2 (two) times daily as needed (fever blisters).   Yes Historical Provider, MD  oxyCODONE (OXY IR/ROXICODONE) 5 MG immediate release tablet Take 1 tablet (5 mg total) by mouth every 6 (six) hours as needed for breakthrough pain. 09/17/15   Tonia Ghent, MD  tamsulosin (FLOMAX) 0.4 MG CAPS capsule Take 1 capsule (0.4 mg total) by mouth daily. 09/17/15   Tonia Ghent, MD   BP 131/78 mmHg  Pulse 83  Temp(Src) 97.8 F (36.6 C) (Oral)  Resp 16  SpO2 95%  LMP 10/07/2010 Physical Exam  Constitutional: She appears well-developed and well-nourished. No distress.  HENT:  Head: Normocephalic and atraumatic.  Nose: Nose normal.  Mouth/Throat: Uvula is midline.  Neck: Normal range of motion. Neck supple.  Cardiovascular: Normal rate and regular rhythm.   Pulmonary/Chest: Effort normal.  Abdominal: Soft. Bowel sounds are  normal. She exhibits no distension. There is no tenderness. There is CVA tenderness (left). There is no rebound and no guarding.  No signs of abdominal distention  Musculoskeletal:  No LE swelling  Neurological: She is alert.  Acting at baseline  Skin: Skin is warm and dry. No rash noted.  Nursing note and vitals reviewed.   ED Course  Procedures (including critical care time) Labs Review Labs Reviewed  BASIC METABOLIC PANEL - Abnormal; Notable for the following:    Glucose, Bld 105 (*)    All other components within normal limits  URINALYSIS, ROUTINE W REFLEX MICROSCOPIC (NOT AT [facility]) - Abnormal; Notable for the following:    APPearance CLOUDY (*)    All other components within normal limits  CBC WITH DIFFERENTIAL/PLATELET  CBC WITH DIFFERENTIAL/PLATELET    Imaging Review US Renal  09/18/2015  CLINICAL DATA:  Left flank pain EXAM: RENAL / URINARY TRACT ULTRASOUND COMPLETE COMPARISON:  09/13/2015 FINDINGS: Right Kidney: Length: 10.4 cm. Echogenicity within normal limits. No mass or hydronephrosis visualized. Left Kidney: Length: 11.5 cm. Echogenicity within normal limits. 3 mm stone at the upper pole. No mass or hydronephrosis visualized. Bladder: Appears normal for degree of bladder distention. Bilateral ureteral jets. IMPRESSION: No hydronephrosis or other acute finding. Left nephrolithiasis. Electronically Signed   By: Monte Fantasia M.D.   On: 09/18/2015 04:39   I have personally reviewed and evaluated these images and lab results as part of my medical decision-making.   EKG Interpretation None      MDM   Final diagnoses:  None    Pt is well appearing on physical exam.  Her vital signs are normal, her renal US shows no hydronephrosis or obstruction of the ureter. BMP is unremarkable. CBC, urinalysis, and chest xray.are pending.  At end of shift patient sign out to Armstead Peaks, PA-C  If patients labs and images are all unremarkable pt should be able to be discharged  with referral to urology, pain medication and nausea medications.    Delos Haring, PA-C 09/18/15 [id number]  Rolland Porter, MD 09/18/15 [phone]

## 2015-09-18 NOTE — ED Notes (Signed)
US Tech at bedside.

## 2015-09-18 NOTE — Assessment & Plan Note (Addendum)
Has passed one stone, has others on both sides.   Would check ucx in the meantime.  I think the newer L sided pain is likely from another stone that is higher up in the urinary tract, d/w pt.  Would still use oxycodone and flomax in meantime with her to update [place] as needed.  I don't see evidence of another acute process, ddx d/w pt.  She agrees.  Okay for outpatient f/u.  Noted on prev CT= Mildly increased density demonstrated throughout the liver. This is nonspecific but could indicate iron.   She has h/o anemia, not iron overload, but I'll defer to PCP.  Patient agrees.   >25 minutes spent in face to face time with patient, >50% spent in counselling or coordination of care.

## 2015-09-18 NOTE — ED Notes (Signed)
Pt BIB EMS from home c/o L sided flank pain radiating from back to groin area. Since Sunday. Has been dx with kidney stones in each kidney. Denies N/V/D. Endorses decreased appetite. 100 mcg fentanyl given in route. Rating pain 2/10. EMS CBG 149, BP 116/75, HR 82, 97%RA. 20g in L wrist placed by EMS.

## 2015-09-18 NOTE — Discharge Instructions (Signed)
Medications: Flexeril, Naprosyn, Zofran  Treatment: Please take Flexeril as prescribed for your back pain and muscle spasms. Do not drive or operate machinery while taking Flexeril. Take Naprosyn twice per day for your pain. Take Zofran every 6 hours as needed for nausea and vomiting. Continue taking Flomax. Drink plenty of fluids. Stretch your back as tolerated, as outlined in your discharge paperwork below.  Follow-up: Please follow-up with your primary care provider at your appointment next week, or sooner if your concerns or develop any new or concerning symptoms. Please return to emergency department if your pain isn't controlled, worsening or you develop any new or concerning symptoms.   Kidney Stones Kidney stones (urolithiasis) are deposits that form inside your kidneys. The intense pain is caused by the stone moving through the urinary tract. When the stone moves, the ureter goes into spasm around the stone. The stone is usually passed in the urine.  CAUSES   A disorder that makes certain neck glands produce too much parathyroid hormone (primary hyperparathyroidism).  A buildup of uric acid crystals, similar to gout in your joints.  Narrowing (stricture) of the ureter.  A kidney obstruction present at birth (congenital obstruction).  Previous surgery on the kidney or ureters.  Numerous kidney infections. SYMPTOMS   Feeling sick to your stomach (nauseous).  Throwing up (vomiting).  Blood in the urine (hematuria).  Pain that usually spreads (radiates) to the groin.  Frequency or urgency of urination. DIAGNOSIS   Taking a history and physical exam.  Blood or urine tests.  CT scan.  Occasionally, an examination of the inside of the urinary bladder (cystoscopy) is performed. TREATMENT   Observation.  Increasing your fluid intake.  Extracorporeal shock wave lithotripsy--This is a noninvasive procedure that uses shock waves to break up kidney stones.  Surgery may  be needed if you have severe pain or persistent obstruction. There are various surgical procedures. Most of the procedures are performed with the use of small instruments. Only small incisions are needed to accommodate these instruments, so recovery time is minimized. The size, location, and chemical composition are all important variables that will determine the proper choice of action for you. Talk to your health care provider to better understand your situation so that you will minimize the risk of injury to yourself and your kidney.  HOME CARE INSTRUCTIONS   Drink enough water and fluids to keep your urine clear or pale yellow. This will help you to pass the stone or stone fragments.  Strain all urine through the provided strainer. Keep all particulate matter and stones for your health care provider to see. The stone causing the pain may be as small as a grain of salt. It is very important to use the strainer each and every time you pass your urine. The collection of your stone will allow your health care provider to analyze it and verify that a stone has actually passed. The stone analysis will often identify what you can do to reduce the incidence of recurrences.  Only take over-the-counter or prescription medicines for pain, discomfort, or fever as directed by your health care provider.  Keep all follow-up visits as told by your health care provider. This is important.  Get follow-up X-rays if required. The absence of pain does not always mean that the stone has passed. It may have only stopped moving. If the urine remains completely obstructed, it can cause loss of kidney function or even complete destruction of the kidney. It is your responsibility to  make sure X-rays and follow-ups are completed. Ultrasounds of the kidney can show blockages and the status of the kidney. Ultrasounds are not associated with any radiation and can be performed easily in a matter of minutes.  Make changes to your  daily diet as told by your health care provider. You may be told to:  Limit the amount of salt that you eat.  Eat 5 or more servings of fruits and vegetables each day.  Limit the amount of meat, poultry, fish, and eggs that you eat.  Collect a 24-hour urine sample as told by your health care provider.You may need to collect another urine sample every 6-12 months. SEEK MEDICAL CARE IF:  You experience pain that is progressive and unresponsive to any pain medicine you have been prescribed. SEEK IMMEDIATE MEDICAL CARE IF:   Pain cannot be controlled with the prescribed medicine.  You have a fever or shaking chills.  The severity or intensity of pain increases over 18 hours and is not relieved by pain medicine.  You develop a new onset of abdominal pain.  You feel faint or pass out.  You are unable to urinate.   This information is not intended to replace advice given to you by your health care provider. Make sure you discuss any questions you have with your health care provider.   Document Released: 05/11/2005 Document Revised: 01/30/2015 Document Reviewed: 10/12/2012 Elsevier Interactive Patient Education 2016 [employer]  Back Pain, Adult Back pain is very common in adults.The cause of back pain is rarely dangerous and the pain often gets better over time.The cause of your back pain may not be known. Some common causes of back pain include:  Strain of the muscles or ligaments supporting the spine.  Wear and tear (degeneration) of the spinal disks.  Arthritis.  Direct injury to the back. For many people, back pain may return. Since back pain is rarely dangerous, most people can learn to manage this condition on their own. HOME CARE INSTRUCTIONS Watch your back pain for any changes. The following actions may help to lessen any discomfort you are feeling:  Remain active. It is stressful on your back to sit or stand in one place for long periods of time. Do not sit,  drive, or stand in one place for more than 30 minutes at a time. Take short walks on even surfaces as soon as you are able.Try to increase the length of time you walk each day.  Exercise regularly as directed by your health care provider. Exercise helps your back heal faster. It also helps avoid future injury by keeping your muscles strong and flexible.  Do not stay in bed.Resting more than 1-2 days can delay your recovery.  Pay attention to your body when you bend and lift. The most comfortable positions are those that put less stress on your recovering back. Always use proper lifting techniques, including:  Bending your knees.  Keeping the load close to your body.  Avoiding twisting.  Find a comfortable position to sleep. Use a firm mattress and lie on your side with your knees slightly bent. If you lie on your back, put a pillow under your knees.  Avoid feeling anxious or stressed.Stress increases muscle tension and can worsen back pain.It is important to recognize when you are anxious or stressed and learn ways to manage it, such as with exercise.  Take medicines only as directed by your health care provider. Over-the-counter medicines to reduce pain and inflammation are often the  most helpful.Your health care provider may prescribe muscle relaxant drugs.These medicines help dull your pain so you can more quickly return to your normal activities and healthy exercise.  Apply ice to the injured area:  Put ice in a plastic bag.  Place a towel between your skin and the bag.  Leave the ice on for 20 minutes, 2-3 times a day for the first 2-3 days. After that, ice and heat may be alternated to reduce pain and spasms.  Maintain a healthy weight. Excess weight puts extra stress on your back and makes it difficult to maintain good posture. SEEK MEDICAL CARE IF:  You have pain that is not relieved with rest or medicine.  You have increasing pain going down into the legs or  buttocks.  You have pain that does not improve in one week.  You have night pain.  You lose weight.  You have a fever or chills. SEEK IMMEDIATE MEDICAL CARE IF:   You develop new bowel or bladder control problems.  You have unusual weakness or numbness in your arms or legs.  You develop nausea or vomiting.  You develop abdominal pain.  You feel faint.   This information is not intended to replace advice given to you by your health care provider. Make sure you discuss any questions you have with your health care provider.   Document Released: 05/11/2005 Document Revised: 06/01/2014 Document Reviewed: 09/12/2013 Elsevier Interactive Patient Education [employer].

## 2015-09-19 LAB — URINE CULTURE
Colony Count: NO GROWTH
Organism ID, Bacteria: NO GROWTH

## 2015-09-25 ENCOUNTER — Encounter: Payer: Self-pay | Admitting: Family Medicine

## 2015-09-25 ENCOUNTER — Other Ambulatory Visit: Payer: Self-pay | Admitting: Family Medicine

## 2015-09-25 ENCOUNTER — Ambulatory Visit (INDEPENDENT_AMBULATORY_CARE_PROVIDER_SITE_OTHER): Payer: BLUE CROSS/BLUE SHIELD | Admitting: Family Medicine

## 2015-09-25 VITALS — BP 114/62 | HR 87 | Temp 98.2°F | Ht 68.0 in | Wt 206.5 lb

## 2015-09-25 DIAGNOSIS — R932 Abnormal findings on diagnostic imaging of liver and biliary tract: Secondary | ICD-10-CM | POA: Diagnosis not present

## 2015-09-25 DIAGNOSIS — R109 Unspecified abdominal pain: Secondary | ICD-10-CM | POA: Insufficient documentation

## 2015-09-25 DIAGNOSIS — N200 Calculus of kidney: Secondary | ICD-10-CM

## 2015-09-25 NOTE — Patient Instructions (Signed)
Drink more water (keep it up)  I expect that urologist may keep your stone to see what type it is  Keeping urine more acidic may be helpful  Hold flomax unless you need it since you have side effects

## 2015-09-25 NOTE — Assessment & Plan Note (Signed)
Passed stone on the R (rev CT from ED visit #1)- she will take that for analysis to urology on 5/8 Had ED visit after that for L flank pain and n/v-no hydronephrosis seen on [place] (rev that and lab and cxr)- that took several days to resolve but better now --? If she also passed one on the L before she got there  Has non obst renal stones remaining - for urol f/u as planned  Disc ways to prev problems- ie lots of fluids and keeping urine acidic  Doing better with water intake  She is holding flomax because she thinks it causes fatigue-but will take if she needs it  Will continue to follow

## 2015-09-25 NOTE — Progress Notes (Signed)
Subjective:    Patient ID: [name] Orr, female    DOB: 1970/09/18, 45 y.o.   MRN: [id number]  HPI Here for f/u of renal stones and flank pain after ED visits   Originally seen and passed R stone -seen in distal ureter on CT Then developed L flank pain (also felt faint and had n/v as well) Seen in ED on 4/26 for this   Chemistry      Component Value Date/Time   NA 139 09/18/2015 0425   K 3.9 09/18/2015 0425   CL 106 09/18/2015 0425   CO2 24 09/18/2015 0425   BUN 12 09/18/2015 0425   CREATININE 0.77 09/18/2015 0425      Component Value Date/Time   CALCIUM 9.3 09/18/2015 0425   ALKPHOS 38 09/13/2015 0136   AST 20 09/13/2015 0136   ALT 12* 09/13/2015 0136   BILITOT 0.6 09/13/2015 0136     Lab Results  Component Value Date   WBC 4.8 09/18/2015   HGB 11.5* 09/18/2015   HCT 33.3* 09/18/2015   MCV 86.7 09/18/2015   PLT 220 09/18/2015   cxr clear Renal US  PACS Images     Show images for US Renal     Study Result     CLINICAL DATA: Left flank pain  EXAM: RENAL / URINARY TRACT ULTRASOUND COMPLETE  COMPARISON: 09/13/2015  FINDINGS: Right Kidney:  Length: 10.4 cm. Echogenicity within normal limits. No mass or hydronephrosis visualized.  Left Kidney:  Length: 11.5 cm. Echogenicity within normal limits. 3 mm stone at the upper pole. No mass or hydronephrosis visualized.  Bladder:  Appears normal for degree of bladder distention. Bilateral ureteral jets.  IMPRESSION: No hydronephrosis or other acute finding.  Left nephrolithiasis.   Electronically Signed  By: Monte Fantasia M.D.  On: 09/18/2015 04:39     Thought poss some MSK etiology of the L pain  Given pain med/ ativan/zofran On flomax Given 20 oxycodone on 4/25  Pt states her pain lasted 3 d after that  Got appetite back on Sunday Now feeling better  Took a muscle relaxer at night  No oxycodone since sat   Took last flomax on Sunday- she said it made her  feel bad and weak and tired  She has them if she needs one   Now she feels so much better- eating well/lots of fluids  Now made a habit of drinking water all the time - previously did not drink enough water   Has urology appt 5/8- will keep that  Will take her stone with her   Urine cx is negative   Patient Active Problem List   Diagnosis Date Noted  . Left flank pain 09/25/2015  . Renal stones 09/18/2015  . Intractable pain 09/13/2015  . Chest pain, atypical 01/10/2014  . Neck pain 01/10/2014  . Dizziness 01/10/2014  . Fever blister 07/06/2013  . Neck pain, bilateral 02/06/2013  . Depression 10/12/2010  . Migraine aura, persistent 10/12/2010  . Endometriosis 10/10/2010   Past Medical History  Diagnosis Date  . Depression   . History of chicken pox   . Blood in stool     bright red each time has BM for atleast 6 mths  . Headache(784.0)   . Fainting spell   . Migraine   . Endometriosis   . Anemia     history  . GERD (gastroesophageal reflux disease)   . Chronic back pain greater than 3 months duration   . Anxiety  no meds   Past Surgical History  Procedure Laterality Date  . Back surgery  [phone]    Dr Glenna Fellows  . Laproscopy    . Knee surgery  2009    lateral realignment to lt knee  . Diagnostic laparoscopy      x 2  . Svd      x 1  . Dilation and curettage of uterus    . Colonscopy     Social History  Substance Use Topics  . Smoking status: Never Smoker   . Smokeless tobacco: Never Used  . Alcohol Use: 0.0 oz/week    0 Standard drinks or equivalent per week     Comment: 3-4 glasses of wine per month   Family History  Problem Relation Age of Onset  . Hyperlipidemia Mother   . Hypertension Mother   . Alcohol abuse Father   . Heart disease Father   . Stroke Father   . Kidney disease Father    Allergies  Allergen Reactions  . Albuterol Other (See Comments)    Headaches  . Penicillins Hives and Other (See Comments)    Has patient had  a PCN reaction causing immediate rash, facial/tongue/throat swelling, SOB or lightheadedness with hypotension: Yes Has patient had a PCN reaction causing severe rash involving mucus membranes or skin necrosis: No Has patient had a PCN reaction that required hospitalization No Has patient had a PCN reaction occurring within the last 10 years: No If all of the above answers are "NO", then may proceed with Cephalosporin use.    Current Outpatient Prescriptions on File Prior to Visit  Medication Sig Dispense Refill  . cyclobenzaprine (FLEXERIL) 10 MG tablet Take 1 tablet (10 mg total) by mouth 3 (three) times daily as needed for muscle spasms. 30 tablet 1  . omeprazole (PRILOSEC) 20 MG capsule Take 20 mg by mouth daily as needed (heartburn).    Marland Kitchen oxyCODONE (OXY IR/ROXICODONE) 5 MG immediate release tablet Take 1 tablet (5 mg total) by mouth every 6 (six) hours as needed for breakthrough pain. 20 tablet 0  . polyethylene glycol (MIRALAX / GLYCOLAX) packet Take 17 g by mouth daily as needed for mild constipation or moderate constipation.     . tamsulosin (FLOMAX) 0.4 MG CAPS capsule Take 1 capsule (0.4 mg total) by mouth daily. 30 capsule 0  . valACYclovir (VALTREX) 1000 MG tablet Take 1,000 mg by mouth 2 (two) times daily as needed (fever blisters).    . loratadine (CLARITIN) 10 MG tablet Take 10 mg by mouth daily as needed. Reported on 09/25/2015     No current facility-administered medications on file prior to visit.    Review of Systems Review of Systems  Constitutional: Negative for fever, appetite change, fatigue and unexpected weight change.  Eyes: Negative for pain and visual disturbance.  Respiratory: Negative for cough and shortness of breath.   Cardiovascular: Negative for cp or palpitations    Gastrointestinal: Negative for nausea, diarrhea and constipation. neg for abd pain or jaundice Genitourinary: Negative for urgency and frequency. pos for recent flank pain that is better  Skin:  Negative for pallor or rash   Neurological: Negative for weakness, light-headedness, numbness and headaches.  Hematological: Negative for adenopathy. Does not bruise/bleed easily.  Psychiatric/Behavioral: Negative for dysphoric mood. The patient is not nervous/anxious.         Objective:   Physical Exam  Constitutional: She appears well-developed and well-nourished. No distress.  obese and well appearing   HENT:  Head: Normocephalic and atraumatic.  Eyes: Conjunctivae and EOM are normal. Pupils are equal, round, and reactive to light.  Neck: Normal range of motion. Neck supple.  Cardiovascular: Normal rate, regular rhythm and normal heart sounds.   Pulmonary/Chest: Effort normal and breath sounds normal.  Abdominal: Soft. Bowel sounds are normal. She exhibits no distension and no mass. There is no hepatosplenomegaly. There is tenderness. There is no rebound and no guarding.  No cva tenderness  Mild suprapubic tenderness  Musculoskeletal: She exhibits no edema.  Lymphadenopathy:    She has no cervical adenopathy.  Neurological: She is alert.  Skin: No rash noted.  Psychiatric: She has a normal mood and affect.          Assessment & Plan:   Problem List Items Addressed This Visit      Genitourinary   Renal stones - Primary    Passed stone on the R (rev CT from ED visit #1)- she will take that for analysis to urology on 5/8 Had ED visit after that for L flank pain and n/v-no hydronephrosis seen on [place] (rev that and lab and cxr)- that took several days to resolve but better now --? If she also passed one on the L before she got there  Has non obst renal stones remaining - for urol f/u as planned  Disc ways to prev problems- ie lots of fluids and keeping urine acidic  Doing better with water intake  She is holding flomax because she thinks it causes fatigue-but will take if she needs it  Will continue to follow         Other   Abnormal CT of liver   Left flank pain     Now resolved See a/p for renal stones  Reassuring exam

## 2015-09-25 NOTE — Assessment & Plan Note (Signed)
Now resolved See a/p for renal stones  Reassuring exam

## 2015-09-25 NOTE — Progress Notes (Signed)
Pre visit review using our clinic review tool, if applicable. No additional management support is needed unless otherwise documented below in the visit note. 

## 2015-09-30 ENCOUNTER — Ambulatory Visit
Admission: RE | Admit: 2015-09-30 | Discharge: 2015-09-30 | Disposition: A | Discharge status: Home / Self Care | Payer: BLUE CROSS/BLUE SHIELD | Source: Ambulatory Visit | Attending: Urology | Admitting: Urology

## 2015-09-30 ENCOUNTER — Encounter: Payer: Self-pay | Admitting: Urology

## 2015-09-30 ENCOUNTER — Ambulatory Visit (INDEPENDENT_AMBULATORY_CARE_PROVIDER_SITE_OTHER): Payer: BLUE CROSS/BLUE SHIELD | Admitting: Urology

## 2015-09-30 DIAGNOSIS — N200 Calculus of kidney: Secondary | ICD-10-CM

## 2015-09-30 DIAGNOSIS — R195 Other fecal abnormalities: Secondary | ICD-10-CM | POA: Diagnosis not present

## 2015-09-30 LAB — URINALYSIS, COMPLETE
BILIRUBIN UA: NEGATIVE
Glucose, UA: NEGATIVE
KETONES UA: NEGATIVE
LEUKOCYTES UA: NEGATIVE
NITRITE UA: NEGATIVE
PH UA: 6 (ref 5.0–7.5)
Protein, UA: NEGATIVE
RBC UA: NEGATIVE
SPEC GRAV UA: 1.01 (ref 1.005–1.030)
Urobilinogen, Ur: 0.2 mg/dL (ref 0.2–1.0)

## 2015-09-30 LAB — MICROSCOPIC EXAMINATION
BACTERIA UA: NONE SEEN
EPITHELIAL CELLS (NON RENAL): NONE SEEN /HPF (ref 0–10)
RBC MICROSCOPIC, UA: NONE SEEN /HPF (ref 0–?)
WBC, UA: NONE SEEN /hpf (ref 0–?)

## 2015-09-30 NOTE — Progress Notes (Signed)
09/30/2015 4:47 PM   [name] Orr 1971-02-23 [id number]  Referring provider: Abner Greenspan, MD [address] [place], [place] [address]  Chief Complaint  Patient presents with  . New Patient (Initial Visit)    Renal Stone    HPI: 45 year old female who presents today for follow-up of her right distal ureteral stone. The patient was seen in the emergency department on 09/13/15 with acute onset renal colic. She was subsequently found to have a 2.9 millimeter stone in the right distal ureter.  In the emergency department she was noted to have no clear evidence of infection and was discharged home on medical expulsion therapy. In addition to having a obstructive distal right ureteral stone she also was found to have bilateral nonobstructing stones. The patient subsequently passed her stone probably one week after initial visits to the emergency room. She brings her stone with her today for further evaluation. The patient denies any ongoing flank pain. She has not had any progressive voiding symptoms including dysuria or gross hematuria. She denies any fevers or chills. She continues to have intermittent bilateral low back pain although these pains or not severe and are fleeting. The patient has no history of kidney stones. She has no history of recurrent urinary tract infections. The patient states that she is chronically dehydrated, drinks very little throughout the day. The patient is overweight but is otherwise quite healthy.   PMH: Past Medical History  Diagnosis Date  . Depression   . History of chicken pox   . Blood in stool     bright red each time has BM for atleast 6 mths  . Headache(784.0)   . Fainting spell   . Migraine   . Endometriosis   . Anemia     history  . GERD (gastroesophageal reflux disease)   . Chronic back pain greater than 3 months duration   . Anxiety     no meds    Surgical History: Past Surgical History    Procedure Laterality Date  . Back surgery  [phone]    Dr Glenna Fellows  . Laproscopy    . Knee surgery  2009    lateral realignment to lt knee  . Diagnostic laparoscopy      x 2  . Svd      x 1  . Dilation and curettage of uterus    . Colonscopy      Home Medications:    Medication List       This list is accurate as of: 09/30/15  4:47 PM.  Always use your most recent med list.               loratadine 10 MG tablet  Commonly known as:  CLARITIN  Take 10 mg by mouth daily as needed. Reported on 09/30/2015     omeprazole 20 MG capsule  Commonly known as:  PRILOSEC  Take 20 mg by mouth daily as needed (heartburn).     oxyCODONE 5 MG immediate release tablet  Commonly known as:  Oxy IR/ROXICODONE  Take 1 tablet (5 mg total) by mouth every 6 (six) hours as needed for breakthrough pain.     polyethylene glycol packet  Commonly known as:  MIRALAX / GLYCOLAX  Take 17 g by mouth daily as needed for mild constipation or moderate constipation. Reported on 09/30/2015     valACYclovir 1000 MG tablet  Commonly known as:  VALTREX  TAKE TWO TABLETS BY MOUTH  TWICE DAILY        Allergies:  Allergies  Allergen Reactions  . Albuterol Other (See Comments)    Headaches  . Penicillins Hives and Other (See Comments)    Has patient had a PCN reaction causing immediate rash, facial/tongue/throat swelling, SOB or lightheadedness with hypotension: Yes Has patient had a PCN reaction causing severe rash involving mucus membranes or skin necrosis: No Has patient had a PCN reaction that required hospitalization No Has patient had a PCN reaction occurring within the last 10 years: No If all of the above answers are "NO", then may proceed with Cephalosporin use.     Family History: Family History  Problem Relation Age of Onset  . Hyperlipidemia Mother   . Hypertension Mother   . Alcohol abuse Father   . Heart disease Father   . Stroke Father   . Kidney disease Father     Social  History:  reports that she has never smoked. She has never used smokeless tobacco. She reports that she drinks alcohol. She reports that she does not use illicit drugs.  ROS: UROLOGY Frequent Urination?: Yes Hard to postpone urination?: No Burning/pain with urination?: No Get up at night to urinate?: No Leakage of urine?: No Urine stream starts and stops?: No Trouble starting stream?: No Do you have to strain to urinate?: No Blood in urine?: No Urinary tract infection?: No Sexually transmitted disease?: No Injury to kidneys or bladder?: No Painful intercourse?: No Weak stream?: No Currently pregnant?: No Vaginal bleeding?: No Last menstrual period?: Hysterectomy  Gastrointestinal Nausea?: Yes Vomiting?: Yes Indigestion/heartburn?: Yes Diarrhea?: No Constipation?: Yes  Constitutional Fever: No Night sweats?: No Weight loss?: No Fatigue?: No  Skin Skin rash/lesions?: No Itching?: No  Eyes Blurred vision?: No Double vision?: No  Ears/Nose/Throat Sore throat?: No Sinus problems?: No  Hematologic/Lymphatic Swollen glands?: No Easy bruising?: No  Cardiovascular Leg swelling?: No Chest pain?: No  Respiratory Cough?: No Shortness of breath?: No  Endocrine Excessive thirst?: No  Musculoskeletal Back pain?: Yes Joint pain?: No  Neurological Headaches?: No Dizziness?: No  Psychologic Depression?: No Anxiety?: No  Physical Exam: BP 126/92 mmHg  Pulse 84  Ht 5\' 8"  (1.727 m)  Wt 204 lb 1.6 oz (92.579 kg)  BMI 31.04 kg/m2  LMP 10/07/2010  Constitutional:  Alert and oriented, No acute distress. HEENT: [place] AT, moist mucus membranes.  Trachea midline, no masses. Cardiovascular: No clubbing, cyanosis, or edema. Respiratory: Normal respiratory effort, no increased work of breathing. GI: Abdomen is soft, nontender, nondistended, no abdominal masses GU: No CVA tenderness.  Skin: No rashes, bruises or suspicious lesions. Lymph: No cervical or inguinal  adenopathy. Neurologic: Grossly intact, no focal deficits, moving all 4 extremities. Psychiatric: Normal mood and affect.  Laboratory Data: Lab Results  Component Value Date   WBC 4.8 09/18/2015   HGB 11.5* 09/18/2015   HCT 33.3* 09/18/2015   MCV 86.7 09/18/2015   PLT 220 09/18/2015    Lab Results  Component Value Date   CREATININE 0.77 09/18/2015    No results found for: PSA  No results found for: TESTOSTERONE  Lab Results  Component Value Date   HGBA1C 5.4 09/13/2015    Urinalysis    Component Value Date/Time   COLORURINE YELLOW 09/18/2015 0523   APPEARANCEUR Cloudy* 09/30/2015 1343   APPEARANCEUR CLOUDY* 09/18/2015 0523   LABSPEC 1.008 09/18/2015 0523   PHURINE 6.5 09/18/2015 0523   GLUCOSEU Negative 09/30/2015 1343   HGBUR NEGATIVE 09/18/2015 0523   BILIRUBINUR Negative 09/30/2015  [place] 09/18/2015 0523   BILIRUBINUR Neg 09/17/2015 [place] 09/18/2015 0523   PROTEINUR Negative 09/30/2015 1343   PROTEINUR NEGATIVE 09/18/2015 0523   PROTEINUR Neg 09/17/2015 1440   UROBILINOGEN 4.0 09/17/2015 1440   NITRITE Negative 09/30/2015 1343   NITRITE NEGATIVE 09/18/2015 0523   NITRITE Neg 09/17/2015 1440   LEUKOCYTESUR Negative 09/30/2015 1343   LEUKOCYTESUR NEGATIVE 09/18/2015 0523    Pertinent Imaging: I've independently reviewed the patient's CT scan from the emergency room as well as a KUB which is performed today. The patient does have some phleboliths on the KUB today with question of a persistent right distal ureteral stone, however she is asymptomatic and her urine analysis is normal.  Assessment & Plan:  I suspect the patient passed her stone given that she has a small fragment of it today and is symptom-free. Her urinalysis shows no evidence of ongoing endoscopic hematuria. Her imaging today does demonstrate fluid was within the pelvis which are confusing and could easily represent a right distal ureteral stone.  1.  Renal stones At this point, we will consider the patient has passed her right distal ureteral stone based on her clinical symptoms and a urinalysis. She does still have bilateral nonobstructing stones. Presuming these are calcium oxalate nature. However her urinalysis to out of the last 4 times has been less than 5.5 leading me to consider uric acid as a potential cause of her stones. We did discussed general stone prevention strategies including hydration and making at least 2.5 liters of urine per day. I also suggested a low-salt diet, avoidance of vitamin C, and adding lemons/lemonade to her diet as well as others high citrate containing foods and drink and considering taking supplements including magnesium and B-6. We then discussed management of her bilateral nonobstructing stones. This point, I recommended that we perform surveillance with a KUB in 1 year. We did discuss return precautions. We also briefly discussed prophylactic surgery which I at this point I recommended against. I did send the patient stone for further analysis. If it is a uric acid stone, we would change her KUB order to a renal ultrasound which should be performed in one year.  - Urinalysis, Complete - Abdomen 1 view (KUB); Future   Return in about 1 year (around 09/29/2016) for w/ KUB prior.  Ardis Hughs, [facility] [address] [place], [place] [address] [phone]

## 2015-10-10 ENCOUNTER — Other Ambulatory Visit: Payer: Self-pay | Admitting: Urology

## 2015-11-07 ENCOUNTER — Emergency Department (HOSPITAL_COMMUNITY)
Admission: EM | Admit: 2015-11-07 | Discharge: 2015-11-08 | Disposition: A | Discharge status: Home / Self Care | Payer: BLUE CROSS/BLUE SHIELD | Attending: Emergency Medicine | Admitting: Emergency Medicine

## 2015-11-07 ENCOUNTER — Encounter (HOSPITAL_COMMUNITY): Payer: Self-pay | Admitting: *Deleted

## 2015-11-07 DIAGNOSIS — N23 Unspecified renal colic: Secondary | ICD-10-CM | POA: Diagnosis not present

## 2015-11-07 DIAGNOSIS — N201 Calculus of ureter: Secondary | ICD-10-CM | POA: Diagnosis not present

## 2015-11-07 DIAGNOSIS — F329 Major depressive disorder, single episode, unspecified: Secondary | ICD-10-CM | POA: Insufficient documentation

## 2015-11-07 DIAGNOSIS — Z79899 Other long term (current) drug therapy: Secondary | ICD-10-CM | POA: Diagnosis not present

## 2015-11-07 DIAGNOSIS — R109 Unspecified abdominal pain: Secondary | ICD-10-CM | POA: Diagnosis present

## 2015-11-07 DIAGNOSIS — N132 Hydronephrosis with renal and ureteral calculous obstruction: Secondary | ICD-10-CM | POA: Diagnosis not present

## 2015-11-07 HISTORY — DX: Disorder of kidney and ureter, unspecified: N28.9

## 2015-11-07 MED ORDER — FENTANYL CITRATE (PF) 100 MCG/2ML IJ SOLN
50.0000 ug | Freq: Once | INTRAMUSCULAR | Status: AC
  Administered 2015-11-07: 50 ug via RESPIRATORY_TRACT
  Filled 2015-11-07: qty 2

## 2015-11-07 MED ORDER — KETOROLAC TROMETHAMINE 30 MG/ML IJ SOLN
30.0000 mg | Freq: Once | INTRAMUSCULAR | Status: AC
  Administered 2015-11-07: 30 mg via INTRAVENOUS
  Filled 2015-11-07: qty 1

## 2015-11-07 MED ORDER — FENTANYL CITRATE (PF) 100 MCG/2ML IJ SOLN
50.0000 ug | INTRAMUSCULAR | Status: DC | PRN
  Administered 2015-11-07: 50 ug via INTRAVENOUS
  Filled 2015-11-07: qty 2

## 2015-11-07 MED ORDER — ONDANSETRON 8 MG PO TBDP
8.0000 mg | ORAL_TABLET | Freq: Once | ORAL | Status: AC
  Administered 2015-11-07: 8 mg via ORAL
  Filled 2015-11-07: qty 1

## 2015-11-07 MED ORDER — HYDROMORPHONE HCL 1 MG/ML IJ SOLN
1.0000 mg | Freq: Once | INTRAMUSCULAR | Status: AC
  Administered 2015-11-07: 1 mg via INTRAVENOUS
  Filled 2015-11-07: qty 1

## 2015-11-07 MED ORDER — ONDANSETRON HCL 4 MG/2ML IJ SOLN
4.0000 mg | Freq: Once | INTRAMUSCULAR | Status: AC
  Administered 2015-11-07: 4 mg via INTRAVENOUS
  Filled 2015-11-07: qty 2

## 2015-11-07 NOTE — ED Provider Notes (Signed)
CSN: [id number]     Arrival date & time 11/07/15  2214 History  By signing my name below, I, Jasmyn B. Alexander, attest that this documentation has been prepared under the direction and in the presence of Orpah Greek, MD.  Electronically Signed: Tedra Coupe. Sheppard Coil, ED Scribe. 11/07/2015. 11:35 PM.   Chief Complaint  Patient presents with  . Flank Pain   The history is provided by the patient. No language interpreter was used.   HPI Comments: [name] Orr is a 45 y.o. female who presents to the Emergency Department complaining of sudden onset, constant, worsening right side flank pain x 2 hrs PTA. Pt reports pain initially was associated with difficulty urinating with slight burning sensation. She also has associated nausea. She says pain is similar to past episodes of kidney stones. Pain is not exacerbated upon pressure of right side flank or lower back. There are no modifying factors. Denies any hematuria.  Past Medical History  Diagnosis Date  . Depression   . History of chicken pox   . Blood in stool     bright red each time has BM for atleast 6 mths  . Headache(784.0)   . Fainting spell   . Migraine   . Endometriosis   . Anemia     history  . GERD (gastroesophageal reflux disease)   . Chronic back pain greater than 3 months duration   . Anxiety     no meds  . Renal disorder     kidney stones   Past Surgical History  Procedure Laterality Date  . Back surgery  [phone]    Dr Glenna Fellows  . Laproscopy    . Knee surgery  2009    lateral realignment to lt knee  . Diagnostic laparoscopy      x 2  . Svd      x 1  . Dilation and curettage of uterus    . Colonscopy     Family History  Problem Relation Age of Onset  . Hyperlipidemia Mother   . Hypertension Mother   . Alcohol abuse Father   . Heart disease Father   . Stroke Father   . Kidney disease Father    Social History  Substance Use Topics  . Smoking status: Never Smoker   . Smokeless  tobacco: Never Used  . Alcohol Use: 0.0 oz/week    0 Standard drinks or equivalent per week     Comment: 3-4 glasses of wine per month   OB History    No data available     Review of Systems  Constitutional: Negative for fever.  Gastrointestinal: Positive for nausea.  Genitourinary: Positive for flank pain and difficulty urinating. Negative for hematuria.  All other systems reviewed and are negative.  Allergies  Albuterol and Penicillins  Home Medications   Prior to Admission medications   Medication Sig Start Date End Date Taking? Authorizing Provider  cyclobenzaprine (FLEXERIL) 10 MG tablet Take 1 tablet by mouth 3 (three) times daily as needed for muscle spasms.  09/29/15  Yes Historical Provider, MD  loratadine (CLARITIN) 10 MG tablet Take 10 mg by mouth daily as needed for allergies. Reported on 09/30/2015   Yes Historical Provider, MD  omeprazole (PRILOSEC) 20 MG capsule Take 20 mg by mouth daily as needed (heartburn).   Yes Historical Provider, MD  polyethylene glycol (MIRALAX / GLYCOLAX) packet Take 17 g by mouth daily as needed for mild constipation or moderate constipation. Reported on 09/30/2015  Yes Historical Provider, MD  oxyCODONE (OXY IR/ROXICODONE) 5 MG immediate release tablet Take 1 tablet (5 mg total) by mouth every 6 (six) hours as needed for breakthrough pain. Patient not taking: Reported on 09/30/2015 09/17/15   Tonia Ghent, MD  valACYclovir (VALTREX) 1000 MG tablet TAKE TWO TABLETS BY MOUTH TWICE DAILY Patient not taking: Reported on 09/30/2015 09/25/15   Abner Greenspan, MD   BP 107/63 mmHg  Pulse 72  Temp(Src) 98.4 F (36.9 C) (Oral)  Resp 14  Ht 5\' 8"  (1.727 m)  Wt 195 lb (88.451 kg)  BMI 29.66 kg/m2  SpO2 96%  LMP 10/07/2010 Physical Exam  Constitutional: She is oriented to person, place, and time. She appears well-developed and well-nourished. She appears distressed.  Yelling, hollering, crying  HENT:  Head: Normocephalic and atraumatic.  Right Ear:  Hearing normal.  Left Ear: Hearing normal.  Nose: Nose normal.  Mouth/Throat: Oropharynx is clear and moist and mucous membranes are normal.  Eyes: Conjunctivae and EOM are normal. Pupils are equal, round, and reactive to light.  Neck: Normal range of motion. Neck supple.  Cardiovascular: Regular rhythm, S1 normal and S2 normal.  Exam reveals no gallop and no friction rub.   No murmur heard. Pulmonary/Chest: Effort normal and breath sounds normal. No respiratory distress. She exhibits no tenderness.  Abdominal: Soft. Normal appearance and bowel sounds are normal. There is no hepatosplenomegaly. There is no tenderness. There is no rebound, no guarding, no tenderness at McBurney's point and negative Murphy's sign. No hernia.  Musculoskeletal: Normal range of motion. She exhibits no tenderness.  Neurological: She is alert and oriented to person, place, and time. She has normal strength. No cranial nerve deficit or sensory deficit. Coordination normal. GCS eye subscore is 4. GCS verbal subscore is 5. GCS motor subscore is 6.  Skin: Skin is warm, dry and intact. No rash noted. No cyanosis.  Psychiatric: She has a normal mood and affect. Her speech is normal and behavior is normal. Thought content normal.  Nursing note and vitals reviewed.   ED Course  Procedures (including critical care time) DIAGNOSTIC STUDIES: Oxygen Saturation is 99% on RA, normal by my interpretation.    COORDINATION OF CARE: 11:36 PM-Discussed treatment plan which includes UA with pt at bedside and pt agreed to plan. Will order Dilaudid, Zofran, Toradol, and Fentanyl.  Labs Review Labs Reviewed  URINALYSIS, ROUTINE W REFLEX MICROSCOPIC (NOT AT [facility]) - Abnormal; Notable for the following:    APPearance CLOUDY (*)    Specific Gravity, Urine 1.032 (*)    All other components within normal limits  BASIC METABOLIC PANEL - Abnormal; Notable for the following:    Glucose, Bld 107 (*)    Creatinine, Ser 1.11 (*)    GFR calc  non Af Amer 59 (*)    All other components within normal limits  CBC WITH DIFFERENTIAL/PLATELET   Imaging Review Ct Renal Stone Study  11/08/2015  CLINICAL DATA:  Initial valuation for acute left flank pain. EXAM: CT ABDOMEN AND PELVIS WITHOUT CONTRAST TECHNIQUE: Multidetector CT imaging of the abdomen and pelvis was performed following the standard protocol without IV contrast. COMPARISON:  Prior radiograph from 09/30/2015. FINDINGS: Mild atelectatic changes present dependently within the visualized lung bases. Visualized lungs are otherwise clear. Limited noncontrast evaluation of the liver is unremarkable. Gallbladder within normal limits. No biliary dilatation. Spleen, adrenal glands, and pancreas demonstrate a normal unenhanced appearance. Punctate nonobstructive calculi measuring approximately 3 mm present within the right kidney. No radiopaque  calculi seen along the course of the right renal collecting system. There is no right-sided hydronephrosis or hydroureter. On the left, there is an obstructive 4-5 mm stone at the left UVJ with secondary mild to moderate left hydroureteronephrosis. Prominent left perinephric and periureteral fat stranding. No other radiopaque calculi present within the left kidney or within the dilated left ureter. Few adjacent vascular phleboliths noted adjacent to the left UVJ. Stomach within normal limits. No evidence for bowel obstruction. No acute inflammatory changes about the bowels. Appendix is normal. Bladder decompressed without definite abnormality. Uterus is absent. Ovaries within normal limits. No free air or fluid. No pathologically enlarged intra-abdominal or pelvic lymph nodes. No acute osseous abnormality. No worrisome lytic or blastic osseous lesions. Transitional lumbosacral anatomy with sacralization of the L5 vertebral body noted. IMPRESSION: 1. Obstructive 4-5 mm stone at the left UVJ with secondary mild to moderate left hydroureteronephrosis. 2. Additional  nonobstructive right renal calculi measuring up to 3 mm. 3. No other acute intra-abdominal or pelvic process. Electronically Signed   By: Jeannine Boga M.D.   On: 11/08/2015 02:29     MDM   Final diagnoses:  None  Renal colic  Patient presents to the emergency department for evaluation of left flank pain. Patient does have a history of kidney stones. Patient was in quite a bit of distress at arrival. She initially did not respond to analgesia. She therefore underwent CT stone study to further evaluate anatomy. She has a 4-5 mm stone at the left UVJ with mild to moderate hydroureteronephrosis. Patient was given progressive doses of analgesia and pain resolved. Patient does have a local urologist. She will require analgesia, Flomax and prompt follow-up.  I personally performed the services described in this documentation, which was scribed in my presence. The recorded information has been reviewed and is accurate.    Orpah Greek, MD 11/08/15 [phone]

## 2015-11-07 NOTE — ED Notes (Signed)
Pt states that she had sudden onset of Rt Flank pain; pt states that approx 2 hrs she kept feeling the urge to urinate but was unable to go; pt states that the last time she urinated it burned slightly but denied blood; pt c/o nausea; pt tearful and crying and hollering in triage

## 2015-11-08 ENCOUNTER — Emergency Department (HOSPITAL_COMMUNITY): Payer: BLUE CROSS/BLUE SHIELD

## 2015-11-08 DIAGNOSIS — N132 Hydronephrosis with renal and ureteral calculous obstruction: Secondary | ICD-10-CM | POA: Diagnosis not present

## 2015-11-08 LAB — CBC WITH DIFFERENTIAL/PLATELET
BASOS PCT: 0 %
Basophils Absolute: 0 10*3/uL (ref 0.0–0.1)
Eosinophils Absolute: 0.2 10*3/uL (ref 0.0–0.7)
Eosinophils Relative: 2 %
HEMATOCRIT: 38.6 % (ref 36.0–46.0)
Hemoglobin: 13.6 g/dL (ref 12.0–15.0)
LYMPHS ABS: 2.9 10*3/uL (ref 0.7–4.0)
LYMPHS PCT: 35 %
MCH: 29.9 pg (ref 26.0–34.0)
MCHC: 35.2 g/dL (ref 30.0–36.0)
MCV: 84.8 fL (ref 78.0–100.0)
MONO ABS: 0.5 10*3/uL (ref 0.1–1.0)
MONOS PCT: 6 %
NEUTROS ABS: 4.8 10*3/uL (ref 1.7–7.7)
Neutrophils Relative %: 57 %
Platelets: 301 10*3/uL (ref 150–400)
RBC: 4.55 MIL/uL (ref 3.87–5.11)
RDW: 12.5 % (ref 11.5–15.5)
WBC: 8.5 10*3/uL (ref 4.0–10.5)

## 2015-11-08 LAB — BASIC METABOLIC PANEL
Anion gap: 7 (ref 5–15)
BUN: 16 mg/dL (ref 6–20)
CALCIUM: 9.6 mg/dL (ref 8.9–10.3)
CO2: 24 mmol/L (ref 22–32)
CREATININE: 1.11 mg/dL — AB (ref 0.44–1.00)
Chloride: 107 mmol/L (ref 101–111)
GFR calc Af Amer: >60 mL/min (ref >60)
GFR calc non Af Amer: 59 mL/min — ABNORMAL LOW (ref >60)
GLUCOSE: 107 mg/dL — AB (ref 65–99)
Potassium: 4.3 mmol/L (ref 3.5–5.1)
Sodium: 138 mmol/L (ref 135–145)

## 2015-11-08 LAB — URINALYSIS, ROUTINE W REFLEX MICROSCOPIC
Bilirubin Urine: NEGATIVE
Glucose, UA: NEGATIVE mg/dL
Hgb urine dipstick: NEGATIVE
KETONES UR: NEGATIVE mg/dL
Leukocytes, UA: NEGATIVE
NITRITE: NEGATIVE
Protein, ur: NEGATIVE mg/dL
Specific Gravity, Urine: 1.032 — ABNORMAL HIGH (ref 1.005–1.030)
pH: 5.5 (ref 5.0–8.0)

## 2015-11-08 MED ORDER — PROMETHAZINE HCL 25 MG PO TABS: 25.0000 mg | ORAL_TABLET | Freq: Four times a day (QID) | ORAL | Status: DC | PRN

## 2015-11-08 MED ORDER — HYDROMORPHONE HCL 2 MG/ML IJ SOLN
2.0000 mg | Freq: Once | INTRAMUSCULAR | Status: DC
  Filled 2015-11-08: qty 1

## 2015-11-08 MED ORDER — HYDROMORPHONE HCL 2 MG/ML IJ SOLN
2.0000 mg | Freq: Once | INTRAMUSCULAR | Status: AC
  Administered 2015-11-08: 2 mg via INTRAVENOUS
  Filled 2015-11-08: qty 1

## 2015-11-08 MED ORDER — OXYCODONE-ACETAMINOPHEN 5-325 MG PO TABS: 1.0000 | ORAL_TABLET | ORAL | Status: DC | PRN

## 2015-11-08 MED ORDER — METOCLOPRAMIDE HCL 5 MG/ML IJ SOLN
10.0000 mg | Freq: Once | INTRAMUSCULAR | Status: AC
  Administered 2015-11-08: 10 mg via INTRAVENOUS
  Filled 2015-11-08: qty 2

## 2015-11-08 MED ORDER — TAMSULOSIN HCL 0.4 MG PO CAPS: 0.4000 mg | ORAL_CAPSULE | Freq: Every day | ORAL | Status: DC

## 2015-11-08 MED ORDER — KETOROLAC TROMETHAMINE 10 MG PO TABS: 10.0000 mg | ORAL_TABLET | Freq: Four times a day (QID) | ORAL | Status: DC | PRN

## 2015-11-08 NOTE — ED Notes (Signed)
Pt retching loudly at this time.

## 2015-11-08 NOTE — ED Notes (Signed)
Pt again screaming in pain. Will notify MD

## 2015-11-08 NOTE — ED Notes (Signed)
Pt to CT via stretcher

## 2015-11-08 NOTE — Discharge Instructions (Signed)
Kidney Stones °Kidney stones (urolithiasis) are deposits that form inside your kidneys. The intense pain is caused by the stone moving through the urinary tract. When the stone moves, the ureter goes into spasm around the stone. The stone is usually passed in the urine.  °CAUSES  °· A disorder that makes certain neck glands produce too much parathyroid hormone (primary hyperparathyroidism). °· A buildup of uric acid crystals, similar to gout in your joints. °· Narrowing (stricture) of the ureter. °· A kidney obstruction present at birth (congenital obstruction). °· Previous surgery on the kidney or ureters. °· Numerous kidney infections. °SYMPTOMS  °· Feeling sick to your stomach (nauseous). °· Throwing up (vomiting). °· Blood in the urine (hematuria). °· Pain that usually spreads (radiates) to the groin. °· Frequency or urgency of urination. °DIAGNOSIS  °· Taking a history and physical exam. °· Blood or urine tests. °· CT scan. °· Occasionally, an examination of the inside of the urinary bladder (cystoscopy) is performed. °TREATMENT  °· Observation. °· Increasing your fluid intake. °· Extracorporeal shock wave lithotripsy--This is a noninvasive procedure that uses shock waves to break up kidney stones. °· Surgery may be needed if you have severe pain or persistent obstruction. There are various surgical procedures. Most of the procedures are performed with the use of small instruments. Only small incisions are needed to accommodate these instruments, so recovery time is minimized. °The size, location, and chemical composition are all important variables that will determine the proper choice of action for you. Talk to your health care provider to better understand your situation so that you will minimize the risk of injury to yourself and your kidney.  °HOME CARE INSTRUCTIONS  °· Drink enough water and fluids to keep your urine clear or pale yellow. This will help you to pass the stone or stone fragments. °· Strain  all urine through the provided strainer. Keep all particulate matter and stones for your health care provider to see. The stone causing the pain may be as small as a grain of salt. It is very important to use the strainer each and every time you pass your urine. The collection of your stone will allow your health care provider to analyze it and verify that a stone has actually passed. The stone analysis will often identify what you can do to reduce the incidence of recurrences. °· Only take over-the-counter or prescription medicines for pain, discomfort, or fever as directed by your health care provider. °· Keep all follow-up visits as told by your health care provider. This is important. °· Get follow-up X-rays if required. The absence of pain does not always mean that the stone has passed. It may have only stopped moving. If the urine remains completely obstructed, it can cause loss of kidney function or even complete destruction of the kidney. It is your responsibility to make sure X-rays and follow-ups are completed. Ultrasounds of the kidney can show blockages and the status of the kidney. Ultrasounds are not associated with any radiation and can be performed easily in a matter of minutes. °· Make changes to your daily diet as told by your health care provider. You may be told to: °¨ Limit the amount of salt that you eat. °¨ Eat 5 or more servings of fruits and vegetables each day. °¨ Limit the amount of meat, poultry, fish, and eggs that you eat. °· Collect a 24-hour urine sample as told by your health care provider. You may need to collect another urine sample every 6-12   months. °SEEK MEDICAL CARE IF: °· You experience pain that is progressive and unresponsive to any pain medicine you have been prescribed. °SEEK IMMEDIATE MEDICAL CARE IF:  °· Pain cannot be controlled with the prescribed medicine. °· You have a fever or shaking chills. °· The severity or intensity of pain increases over 18 hours and is not  relieved by pain medicine. °· You develop a new onset of abdominal pain. °· You feel faint or pass out. °· You are unable to urinate. °  °This information is not intended to replace advice given to you by your health care provider. Make sure you discuss any questions you have with your health care provider. °  °Document Released: 05/11/2005 Document Revised: 01/30/2015 Document Reviewed: 10/12/2012 °Elsevier Interactive Patient Education ©2016 [employer] ° °

## 2015-11-08 NOTE — ED Notes (Signed)
Pt states she feels her bladder is full but she is is unable to urinate. Bladder scan completed, max 24ml found in bladder

## 2015-11-12 ENCOUNTER — Ambulatory Visit (INDEPENDENT_AMBULATORY_CARE_PROVIDER_SITE_OTHER): Payer: BLUE CROSS/BLUE SHIELD | Admitting: Urology

## 2015-11-12 ENCOUNTER — Encounter: Payer: Self-pay | Admitting: Urology

## 2015-11-12 VITALS — BP 127/79 | HR 76 | Ht 68.0 in | Wt 197.7 lb

## 2015-11-12 DIAGNOSIS — N200 Calculus of kidney: Secondary | ICD-10-CM

## 2015-11-12 NOTE — Progress Notes (Signed)
4:43 PM   [name] Orr April 10, 1971 [id number]  Referring provider: Abner Greenspan, MD [facility] [address] [place], [place] [address]  Chief Complaint  Patient presents with  . Follow-up    Renal stone     HPI: 45 year old female who presents todayFor follow-up from the emergency department where she was seen 3 days ago for a left distal ureteral stone. I saw the patient up roughly 1 month prior for right distal ureteral stone. The patient presents today with her stone, having passed it the day following her emergency room visit. At the time of her emergency room visit she underwent a CT scan which demonstrated that the previously seen stone in the left collecting system is no longer present. Presently, this is the stone and the patient passed. There were no additional stones in the left and 2 small stones that were pre-existing in the right side. These were unchanged in appearance. The patient reports feeling terrible up until yesterday, she now denies any flank pain, voiding symptoms, or ongoing renal colic symptoms.  At the patient's last visit we discussed stone prevention strategies and the patient has implemented several of these. She has begun drinking is significantly more amount of water, adopted a low-salt diet, and has been doing lemonade therapy. She has lost 10 pounds over the past month.  Stone composition: The patient's stone is predominantly a calcium phosphate stone. The patient has no family history of kidney stones.  PMH: Past Medical History  Diagnosis Date  . Depression   . History of chicken pox   . Blood in stool     bright red each time has BM for atleast 6 mths  . Headache(784.0)   . Fainting spell   . Migraine   . Endometriosis   . Anemia     history  . GERD (gastroesophageal reflux disease)   . Chronic back pain greater than 3 months duration   . Anxiety     no meds  . Renal disorder     kidney stones     Surgical History: Past Surgical History  Procedure Laterality Date  . Back surgery  [phone]    Dr Glenna Fellows  . Laproscopy    . Knee surgery  2009    lateral realignment to lt knee  . Diagnostic laparoscopy      x 2  . Svd      x 1  . Dilation and curettage of uterus    . Colonscopy      Home Medications:    Medication List       This list is accurate as of: 11/12/15  4:43 PM.  Always use your most recent med list.               cyclobenzaprine 10 MG tablet  Commonly known as:  FLEXERIL  Take 1 tablet by mouth 3 (three) times daily as needed for muscle spasms. Reported on 11/12/2015     ketorolac 10 MG tablet  Commonly known as:  TORADOL  Take 1 tablet (10 mg total) by mouth every 6 (six) hours as needed.     loratadine 10 MG tablet  Commonly known as:  CLARITIN  Take 10 mg by mouth daily as needed for allergies. Reported on 11/12/2015     omeprazole 20 MG capsule  Commonly known as:  PRILOSEC  Take 20 mg by mouth daily as needed (heartburn). Reported on 11/12/2015     oxyCODONE  5 MG immediate release tablet  Commonly known as:  Oxy IR/ROXICODONE  Take 1 tablet (5 mg total) by mouth every 6 (six) hours as needed for breakthrough pain.     oxyCODONE-acetaminophen 5-325 MG tablet  Commonly known as:  PERCOCET  Take 1-2 tablets by mouth every 4 (four) hours as needed.     polyethylene glycol packet  Commonly known as:  MIRALAX / GLYCOLAX  Take 17 g by mouth daily as needed for mild constipation or moderate constipation. Reported on 11/12/2015     promethazine 25 MG tablet  Commonly known as:  PHENERGAN  Take 1 tablet (25 mg total) by mouth every 6 (six) hours as needed for nausea or vomiting.     tamsulosin 0.4 MG Caps capsule  Commonly known as:  FLOMAX  Take 1 capsule (0.4 mg total) by mouth daily.     valACYclovir 1000 MG tablet  Commonly known as:  VALTREX  TAKE TWO TABLETS BY MOUTH TWICE DAILY        Allergies:  Allergies  Allergen  Reactions  . Albuterol Other (See Comments)    Headaches  . Penicillins Hives and Other (See Comments)    Has patient had a PCN reaction causing immediate rash, facial/tongue/throat swelling, SOB or lightheadedness with hypotension: Yes Has patient had a PCN reaction causing severe rash involving mucus membranes or skin necrosis: No Has patient had a PCN reaction that required hospitalization No Has patient had a PCN reaction occurring within the last 10 years: No If all of the above answers are "NO", then may proceed with Cephalosporin use.     Family History: Family History  Problem Relation Age of Onset  . Hyperlipidemia Mother   . Hypertension Mother   . Alcohol abuse Father   . Heart disease Father   . Stroke Father   . Kidney disease Father     Social History:  reports that she has never smoked. She has never used smokeless tobacco. She reports that she drinks alcohol. She reports that she does not use illicit drugs.  ROS: UROLOGY Frequent Urination?: No Hard to postpone urination?: Yes Burning/pain with urination?: No Get up at night to urinate?: No Leakage of urine?: Yes Urine stream starts and stops?: No Trouble starting stream?: No Do you have to strain to urinate?: No Blood in urine?: No Urinary tract infection?: No Sexually transmitted disease?: No Injury to kidneys or bladder?: No Painful intercourse?: No Weak stream?: No Currently pregnant?: No Vaginal bleeding?: No Last menstrual period?: Hysterectomy  Gastrointestinal Nausea?: Yes Vomiting?: Yes Indigestion/heartburn?: No Diarrhea?: No Constipation?: Yes  Constitutional Fever: No Night sweats?: No Weight loss?: No Fatigue?: Yes  Skin Skin rash/lesions?: No Itching?: No  Eyes Blurred vision?: No Double vision?: No  Ears/Nose/Throat Sore throat?: No Sinus problems?: No  Hematologic/Lymphatic Swollen glands?: No Easy bruising?: No  Cardiovascular Leg swelling?: No Chest pain?:  No  Respiratory Cough?: No Shortness of breath?: No  Endocrine Excessive thirst?: No  Musculoskeletal Back pain?: No Joint pain?: No  Neurological Headaches?: No Dizziness?: No  Psychologic Depression?: No Anxiety?: No  Physical Exam: BP 127/79 mmHg  Pulse 76  Ht 5\' 8"  (1.727 m)  Wt 197 lb 11.2 oz (89.676 kg)  BMI 30.07 kg/m2  LMP 10/07/2010    Laboratory Data: Lab Results  Component Value Date   WBC 8.5 11/07/2015   HGB 13.6 11/07/2015   HCT 38.6 11/07/2015   MCV 84.8 11/07/2015   PLT 301 11/07/2015    Lab Results  Component Value Date   CREATININE 1.11* 11/07/2015    No results found for: PSA  No results found for: TESTOSTERONE  Lab Results  Component Value Date   HGBA1C 5.4 09/13/2015    Urinalysis    Component Value Date/Time   COLORURINE YELLOW 11/08/2015 0138   APPEARANCEUR CLOUDY* 11/08/2015 0138   APPEARANCEUR Cloudy* 09/30/2015 1343   LABSPEC 1.032* 11/08/2015 0138   PHURINE 5.5 11/08/2015 0138   GLUCOSEU NEGATIVE 11/08/2015 0138   HGBUR NEGATIVE 11/08/2015 0138   BILIRUBINUR NEGATIVE 11/08/2015 0138   BILIRUBINUR Negative 09/30/2015 1343   BILIRUBINUR Neg 09/17/2015 1440   KETONESUR NEGATIVE 11/08/2015 0138   PROTEINUR NEGATIVE 11/08/2015 0138   PROTEINUR Negative 09/30/2015 1343   PROTEINUR Neg 09/17/2015 1440   UROBILINOGEN 4.0 09/17/2015 1440   NITRITE NEGATIVE 11/08/2015 0138   NITRITE Negative 09/30/2015 1343   NITRITE Neg 09/17/2015 1440   LEUKOCYTESUR NEGATIVE 11/08/2015 0138   LEUKOCYTESUR Negative 09/30/2015 1343    Pertinent Imaging: I've independently reviewed the patient's CT scan from the emergency room.  The findings were as noted above.  Assessment & Plan:  Interestingly, the patient has calcium phosphate predominant stones. This would be unusual in a patient who does not have a family history of kidney stones. I did inquire about symptoms of hypercalcemia and the patient does state that she has some eye  twitching, constipation, and some generalized muscle soreness.  1. Renal stones At this point, the patient has 2 small/very small stones in her right kidney. There are no additional stones on the left. She has vague symptoms of hypercalcemia but I think working her up for hyperparathyroid disease is reasonable. As such, I checked a PTH, calcium, and vitamin D level.  If the patient's labs are normal we will plan a follow-up as scheduled in a year. If they're abnormal, I'll contact the patient to discuss this with her but she would need a referral to a general surgeon for further evaluation.  No Follow-up on file.  Ardis Hughs, [facility] [address] [place], [place] [address] [phone]

## 2015-11-13 LAB — CALCIUM: CALCIUM: 9.8 mg/dL (ref 8.7–10.2)

## 2015-11-13 LAB — PARATHYROID HORMONE, INTACT (NO CA): PTH: 27 pg/mL (ref 15–65)

## 2015-11-13 LAB — VITAMIN D 25 HYDROXY (VIT D DEFICIENCY, FRACTURES): VIT D 25 HYDROXY: 31.2 ng/mL (ref 30.0–100.0)

## 2015-11-14 ENCOUNTER — Telehealth: Payer: Self-pay

## 2015-11-14 NOTE — Telephone Encounter (Signed)
-----   Message from Ardis Hughs, MD sent at 11/13/2015  8:53 PM EDT ----- Regarding: call back Can you please call this patient and let her know that her labs are normal.  We will plan to follow-up as scheduled next year. Thanks, Dr. Lemmie Evens ----- Message -----    From: Lestine Box, LPN    Sent: [id number]   8:29 AM      To: Ardis Hughs, MD    ----- Message -----    From: [facility] Lab Results In Interface    Sent: 11/13/2015   7:43 AM      To: [facility]

## 2015-11-14 NOTE — Telephone Encounter (Signed)
Spoke with pt in reference to lab results. Pt voiced understanding.  

## 2016-01-30 DIAGNOSIS — Z01419 Encounter for gynecological examination (general) (routine) without abnormal findings: Secondary | ICD-10-CM | POA: Diagnosis not present

## 2016-01-30 DIAGNOSIS — Z1231 Encounter for screening mammogram for malignant neoplasm of breast: Secondary | ICD-10-CM | POA: Diagnosis not present

## 2016-01-30 DIAGNOSIS — Z6830 Body mass index (BMI) 30.0-30.9, adult: Secondary | ICD-10-CM | POA: Diagnosis not present

## 2016-08-11 DIAGNOSIS — M7712 Lateral epicondylitis, left elbow: Secondary | ICD-10-CM | POA: Diagnosis not present

## 2016-09-10 DIAGNOSIS — M7712 Lateral epicondylitis, left elbow: Secondary | ICD-10-CM | POA: Diagnosis not present

## 2016-10-01 ENCOUNTER — Ambulatory Visit: Payer: BLUE CROSS/BLUE SHIELD

## 2016-10-22 DIAGNOSIS — M7712 Lateral epicondylitis, left elbow: Secondary | ICD-10-CM | POA: Diagnosis not present

## 2016-10-27 DIAGNOSIS — M7712 Lateral epicondylitis, left elbow: Secondary | ICD-10-CM | POA: Diagnosis not present

## 2016-10-27 DIAGNOSIS — M25522 Pain in left elbow: Secondary | ICD-10-CM | POA: Diagnosis not present

## 2016-11-04 DIAGNOSIS — M7712 Lateral epicondylitis, left elbow: Secondary | ICD-10-CM | POA: Diagnosis not present

## 2016-11-04 DIAGNOSIS — M25522 Pain in left elbow: Secondary | ICD-10-CM | POA: Diagnosis not present

## 2016-11-12 DIAGNOSIS — M25522 Pain in left elbow: Secondary | ICD-10-CM | POA: Diagnosis not present

## 2016-11-12 DIAGNOSIS — M7712 Lateral epicondylitis, left elbow: Secondary | ICD-10-CM | POA: Diagnosis not present

## 2016-11-19 DIAGNOSIS — M25522 Pain in left elbow: Secondary | ICD-10-CM | POA: Diagnosis not present

## 2016-11-19 DIAGNOSIS — M7712 Lateral epicondylitis, left elbow: Secondary | ICD-10-CM | POA: Diagnosis not present

## 2017-01-07 ENCOUNTER — Ambulatory Visit (INDEPENDENT_AMBULATORY_CARE_PROVIDER_SITE_OTHER): Payer: BLUE CROSS/BLUE SHIELD | Admitting: Adult Health

## 2017-01-07 ENCOUNTER — Encounter: Payer: Self-pay | Admitting: Adult Health

## 2017-01-07 VITALS — BP 116/81 | HR 80 | Ht 68.0 in | Wt 199.3 lb

## 2017-01-07 DIAGNOSIS — Z0000 Encounter for general adult medical examination without abnormal findings: Secondary | ICD-10-CM

## 2017-01-07 NOTE — Patient Instructions (Addendum)
Heart-Healthy Eating Plan Many factors influence your heart health, including eating and exercise habits. Heart (coronary) risk increases with abnormal blood fat (lipid) levels. Heart-healthy meal planning includes limiting unhealthy fats, increasing healthy fats, and making other small dietary changes. This includes maintaining a healthy body weight to help keep lipid levels within a normal range. What is my plan? Your health care provider recommends that you:  Get no more than ___25__% of the total calories in your daily diet from fat.  Limit your intake of saturated fat to less than ___5__% of your total calories each day.  Limit the amount of cholesterol in your diet to less than _300_ mg per day.  What types of fat should I choose?  Choose healthy fats more often. Choose monounsaturated and polyunsaturated fats, such as olive oil and canola oil, flaxseeds, walnuts, almonds, and seeds.  Eat more omega-3 fats. Good choices include salmon, mackerel, sardines, tuna, flaxseed oil, and ground flaxseeds. Aim to eat fish at least two times each week.  Limit saturated fats. Saturated fats are primarily found in animal products, such as meats, butter, and cream. Plant sources of saturated fats include palm oil, palm kernel oil, and coconut oil.  Avoid foods with partially hydrogenated oils in them. These contain trans fats. Examples of foods that contain trans fats are stick margarine, some tub margarines, cookies, crackers, and other baked goods. What general guidelines do I need to follow?  Check food labels carefully to identify foods with trans fats or high amounts of saturated fat.  Fill one half of your plate with vegetables and green salads. Eat 4-5 servings of vegetables per day. A serving of vegetables equals 1 cup of raw leafy vegetables,  cup of raw or cooked cut-up vegetables, or  cup of vegetable juice.  Fill one fourth of your plate with whole grains. Look for the word "whole" as  the first word in the ingredient list.  Fill one fourth of your plate with lean protein foods.  Eat 4-5 servings of fruit per day. A serving of fruit equals one medium whole fruit,  cup of dried fruit,  cup of fresh, frozen, or canned fruit, or  cup of 100% fruit juice.  Eat more foods that contain soluble fiber. Examples of foods that contain this type of fiber are apples, broccoli, carrots, beans, peas, and barley. Aim to get 20-30 g of fiber per day.  Eat more home-cooked food and less restaurant, buffet, and fast food.  Limit or avoid alcohol.  Limit foods that are high in starch and sugar.  Avoid fried foods.  Cook foods by using methods other than frying. Baking, boiling, grilling, and broiling are all great options. Other fat-reducing suggestions include: ? Removing the skin from poultry. ? Removing all visible fats from meats. ? Skimming the fat off of stews, soups, and gravies before serving them. ? Steaming vegetables in water or broth.  Lose weight if you are overweight. Losing just 5-10% of your initial body weight can help your overall health and prevent diseases such as diabetes and heart disease.  Increase your consumption of nuts, legumes, and seeds to 4-5 servings per week. One serving of dried beans or legumes equals  cup after being cooked, one serving of nuts equals 1 ounces, and one serving of seeds equals  ounce or 1 tablespoon.  You may need to monitor your salt (sodium) intake, especially if you have high blood pressure. Talk with your health care provider or dietitian to get  more information about reducing sodium. What foods can I eat? Grains  Breads, including [place], white, pita, wheat, raisin, rye, oatmeal, and [place]. Tortillas that are neither fried nor made with lard or trans fat. Low-fat rolls, including hotdog and hamburger buns and English muffins. Biscuits. Muffins. Waffles. Pancakes. Light popcorn. Whole-grain cereals. Flatbread. Melba toast.  Pretzels. Breadsticks. Rusks. Low-fat snacks and crackers, including oyster, saltine, matzo, graham, animal, and rye. Rice and pasta, including brown rice and those that are made with whole wheat. Vegetables All vegetables. Fruits All fruits, but limit coconut. Meats and Other Protein Sources Lean, well-trimmed beef, veal, pork, and lamb. Chicken and [place] without skin. All fish and shellfish. Wild duck, rabbit, pheasant, and venison. Egg whites or low-cholesterol egg substitutes. Dried beans, peas, lentils, and tofu.Seeds and most nuts. Dairy Low-fat or nonfat cheeses, including ricotta, string, and mozzarella. Skim or 1% milk that is liquid, powdered, or evaporated. Buttermilk that is made with low-fat milk. Nonfat or low-fat yogurt. Beverages Mineral water. Diet carbonated beverages. Sweets and Desserts Sherbets and fruit ices. Honey, jam, marmalade, jelly, and syrups. Meringues and gelatins. Pure sugar candy, such as hard candy, jelly beans, gumdrops, mints, marshmallows, and small amounts of dark chocolate. [employer] Eat all sweets and desserts in moderation. Fats and Oils Nonhydrogenated (trans-free) margarines. Vegetable oils, including soybean, sesame, sunflower, olive, peanut, safflower, corn, canola, and cottonseed. Salad dressings or mayonnaise that are made with a vegetable oil. Limit added fats and oils that you use for cooking, baking, salads, and as spreads. Other Cocoa powder. Coffee and tea. All seasonings and condiments. The items listed above may not be a complete list of recommended foods or beverages. Contact your dietitian for more options. What foods are not recommended? Grains Breads that are made with saturated or trans fats, oils, or whole milk. Croissants. Butter rolls. Cheese breads. Sweet rolls. Donuts. Buttered popcorn. Chow mein noodles. High-fat crackers, such as cheese or butter crackers. Meats and Other Protein Sources Fatty meats, such as hotdogs,  short ribs, sausage, spareribs, bacon, ribeye roast or steak, and mutton. High-fat deli meats, such as salami and bologna. Caviar. Domestic duck and goose. Organ meats, such as kidney, liver, sweetbreads, brains, gizzard, chitterlings, and heart. Dairy Cream, sour cream, cream cheese, and creamed cottage cheese. Whole milk cheeses, including blue (bleu), Monterey Jack, [place], [place], American, [place], Swiss, [place], [place], and [place]. Whole or 2% milk that is liquid, evaporated, or condensed. Whole buttermilk. Cream sauce or high-fat cheese sauce. Yogurt that is made from whole milk. Beverages Regular sodas and drinks with added sugar. Sweets and Desserts Frosting. Pudding. Cookies. Cakes other than angel food cake. Candy that has milk chocolate or white chocolate, hydrogenated fat, butter, coconut, or unknown ingredients. Buttered syrups. Full-fat ice cream or ice cream drinks. Fats and Oils Gravy that has suet, meat fat, or shortening. Cocoa butter, hydrogenated oils, palm oil, coconut oil, palm kernel oil. These can often be found in baked products, candy, fried foods, nondairy creamers, and whipped toppings. Solid fats and shortenings, including bacon fat, salt pork, lard, and butter. Nondairy cream substitutes, such as coffee creamers and sour cream substitutes. Salad dressings that are made of unknown oils, cheese, or sour cream. The items listed above may not be a complete list of foods and beverages to avoid. Contact your dietitian for more information. This information is not intended to replace advice given to you by your health care provider. Make sure you discuss any questions you have with your health care  provider. Document Released: 02/18/2008 Document Revised: 11/29/2015 Document Reviewed: 11/02/2013 Elsevier Interactive Patient Education  2017 [employer]vier Inc.   Back Pain, Adult Back pain is very common in adults.The cause of back pain is rarely dangerous and the pain often gets  better over time.The cause of your back pain may not be known. Some common causes of back pain include:  Strain of the muscles or ligaments supporting the spine.  Wear and tear (degeneration) of the spinal disks.  Arthritis.  Direct injury to the back.  For many people, back pain may return. Since back pain is rarely dangerous, most people can learn to manage this condition on their own. Follow these instructions at home: Watch your back pain for any changes. The following actions may help to lessen any discomfort you are feeling:  Remain active. It is stressful on your back to sit or stand in one place for long periods of time. Do not sit, drive, or stand in one place for more than 30 minutes at a time. Take short walks on even surfaces as soon as you are able.Try to increase the length of time you walk each day.  Exercise regularly as directed by your health care provider. Exercise helps your back heal faster. It also helps avoid future injury by keeping your muscles strong and flexible.  Do not stay in bed.Resting more than 1-2 days can delay your recovery.  Pay attention to your body when you bend and lift. The most comfortable positions are those that put less stress on your recovering back. Always use proper lifting techniques, including: ? Bending your knees. ? Keeping the load close to your body. ? Avoiding twisting.  Find a comfortable position to sleep. Use a firm mattress and lie on your side with your knees slightly bent. If you lie on your back, put a pillow under your knees.  Avoid feeling anxious or stressed.Stress increases muscle tension and can worsen back pain.It is important to recognize when you are anxious or stressed and learn ways to manage it, such as with exercise.  Take medicines only as directed by your health care provider. Over-the-counter medicines to reduce pain and inflammation are often the most helpful.Your health care provider may prescribe  muscle relaxant drugs.These medicines help dull your pain so you can more quickly return to your normal activities and healthy exercise.  Apply ice to the injured area: ? Put ice in a plastic bag. ? Place a towel between your skin and the bag. ? Leave the ice on for 20 minutes, 2-3 times a day for the first 2-3 days. After that, ice and heat may be alternated to reduce pain and spasms.  Maintain a healthy weight. Excess weight puts extra stress on your back and makes it difficult to maintain good posture.  Contact a health care provider if:  You have pain that is not relieved with rest or medicine.  You have increasing pain going down into the legs or buttocks.  You have pain that does not improve in one week.  You have night pain.  You lose weight.  You have a fever or chills. Get help right away if:  You develop new bowel or bladder control problems.  You have unusual weakness or numbness in your arms or legs.  You develop nausea or vomiting.  You develop abdominal pain.  You feel faint. This information is not intended to replace advice given to you by your health care provider. Make sure you discuss any questions  you have with your health care provider. Document Released: 05/11/2005 Document Revised: 09/19/2015 Document Reviewed: 09/12/2013 Elsevier Interactive Patient Education  2017 [place] your excellent water intake. Follow heart healthy diet and try to meal prep. Continue walking and increase cardio intensity to aid in weight loss-YouTube/Pinterst vides. Start taking Tumeric daily to help with joint pain, please make follow-up appt in few weeks to discuss various musculoskeletal issues. We will call you when your lab results are available. Recommend annual physical. WELCOME TO THE PRACTICE!

## 2017-01-07 NOTE — Assessment & Plan Note (Signed)
  Continue your excellent water intake. Follow heart healthy diet and try to meal prep. Continue walking and increase cardio intensity to aid in weight loss-YouTube/Pinterst vides. Start taking Tumeric daily to help with joint pain, please make follow-up appt in few weeks to discuss various musculoskeletal issues. We will call you when your lab results are available. Recommend annual physical.

## 2017-01-07 NOTE — Progress Notes (Signed)
Subjective:    Patient ID: [name] Orr, female    DOB: [birth year], 46 y.o.   MRN: [id number]  HPI:  [name] presents to establish as a new pt.  She is a very pleasant 46 year old female.  PMH: Chronic renal stones (passed last stone July 2018), Migraines, and joint pain (L shoulder, lumbar back). She denies medication on a daily/routine basis.  She drinks >100 ounces of water/day and eats a diet rich in fruits/vegetables/lean proteins, however will go long periods of not eating.  She walks 3 times week or >30 mins, however is frustrated that she is not losing more weight.  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 She underwent several back surgeries in her early 59s for pain/imobility r/t DDD. She denies tobacco use/excessive EOTH use.   Patient Care Team    Relationship Specialty Notifications Start End  Esaw Grandchild, NP PCP - General Family Medicine  01/07/17     Patient Active Problem List   Diagnosis Date Noted  . Healthcare maintenance 01/07/2017  . Left flank pain 09/25/2015  . Abnormal CT of liver 09/25/2015  . Renal stones 09/18/2015  . Intractable pain 09/13/2015  . Chest pain, atypical 01/10/2014  . Neck pain 01/10/2014  . Dizziness 01/10/2014  . Fever blister 07/06/2013  . Neck pain, bilateral 02/06/2013  . Depression 10/12/2010  . Migraine aura, persistent 10/12/2010  . Endometriosis 10/10/2010     Past Medical History:  Diagnosis Date  . Anemia    history  . Anxiety    no meds  . Blood in stool    bright red each time has BM for atleast 6 mths  . Chronic back pain greater than 3 months duration   . Depression   . Endometriosis   . Fainting spell   . GERD (gastroesophageal reflux disease)   . Headache(784.0)   . History of chicken pox   . Migraine   . Renal disorder    kidney stones     Past Surgical History:  Procedure Laterality Date  . ABDOMINAL HYSTERECTOMY    . BACK SURGERY   [phone]   Dr Glenna Fellows  . colonscopy    . DIAGNOSTIC LAPAROSCOPY     x 2  . DILATION AND CURETTAGE OF UTERUS    . KNEE SURGERY  2009   lateral realignment to lt knee  . laproscopy    . svd     x 1     Family History  Problem Relation Age of Onset  . Hyperlipidemia Mother   . Hypertension Mother   . Alcohol abuse Father   . Heart disease Father   . Stroke Father   . Kidney disease Father      History  Drug Use No     History  Alcohol Use  . 0.0 oz/week    Comment: 3-4 glasses of wine per month     History  Smoking Status  . Never Smoker  Smokeless Tobacco  . Never Used     Outpatient Encounter Prescriptions as of 01/07/2017  Medication Sig  . cyclobenzaprine (FLEXERIL) 10 MG tablet Take 1 tablet by mouth 3 (three) times daily as needed for muscle spasms. Reported on 11/12/2015  . valACYclovir (VALTREX) 1000 MG tablet TAKE TWO TABLETS BY MOUTH TWICE DAILY  . [DISCONTINUED] tamsulosin (FLOMAX) 0.4 MG CAPS capsule Take 1 capsule (0.4 mg total) by mouth daily.  . [DISCONTINUED] ketorolac (TORADOL) 10 MG  tablet Take 1 tablet (10 mg total) by mouth every 6 (six) hours as needed. (Patient not taking: Reported on 11/12/2015)  . [DISCONTINUED] loratadine (CLARITIN) 10 MG tablet Take 10 mg by mouth daily as needed for allergies. Reported on 11/12/2015  . [DISCONTINUED] omeprazole (PRILOSEC) 20 MG capsule Take 20 mg by mouth daily as needed (heartburn). Reported on 11/12/2015  . [DISCONTINUED] oxyCODONE (OXY IR/ROXICODONE) 5 MG immediate release tablet Take 1 tablet (5 mg total) by mouth every 6 (six) hours as needed for breakthrough pain. (Patient not taking: Reported on 09/30/2015)  . [DISCONTINUED] oxyCODONE-acetaminophen (PERCOCET) 5-325 MG tablet Take 1-2 tablets by mouth every 4 (four) hours as needed. (Patient not taking: Reported on 11/12/2015)  . [DISCONTINUED] polyethylene glycol (MIRALAX / GLYCOLAX) packet Take 17 g by mouth daily as needed for mild constipation or  moderate constipation. Reported on 11/12/2015  . [DISCONTINUED] promethazine (PHENERGAN) 25 MG tablet Take 1 tablet (25 mg total) by mouth every 6 (six) hours as needed for nausea or vomiting. (Patient not taking: Reported on 11/12/2015)   No facility-administered encounter medications on file as of 01/07/2017.     Allergies: Albuterol and Penicillins  Body mass index is 30.3 kg/m.  Blood pressure 116/81, pulse 80, height 5\' 8"  (1.727 m), weight 199 lb 4.8 oz (90.4 kg), last menstrual period 10/16/2010.   Review of Systems  Constitutional: Positive for fatigue. Negative for activity change, appetite change, chills, diaphoresis, fever and unexpected weight change.  HENT: Negative for congestion.   Eyes: Negative for visual disturbance.  Respiratory: Negative for cough, chest tightness, shortness of breath, wheezing and stridor.   Cardiovascular: Negative for chest pain, palpitations and leg swelling.  Gastrointestinal: Negative for abdominal distention, abdominal pain, blood in stool, constipation, diarrhea, nausea and vomiting.  Endocrine: Negative for cold intolerance, heat intolerance, polydipsia, polyphagia and polyuria.  Genitourinary: Negative for difficulty urinating, flank pain and hematuria.  Musculoskeletal: Positive for arthralgias, back pain and myalgias. Negative for gait problem, neck pain and neck stiffness.  Skin: Negative for color change, pallor, rash and wound.  Neurological: Negative for dizziness, tremors, weakness and headaches.  Hematological: Does not bruise/bleed easily.  Psychiatric/Behavioral: Negative for confusion, self-injury, sleep disturbance and suicidal ideas. The patient is not nervous/anxious and is not hyperactive.        Objective:   Physical Exam  Constitutional: She is oriented to person, place, and time. She appears well-developed and well-nourished. No distress.  HENT:  Head: Normocephalic and atraumatic.  Right Ear: External ear normal.   Left Ear: External ear normal.  Eyes: Pupils are equal, round, and reactive to light. Conjunctivae are normal.  Neck: Normal range of motion. Neck supple.  Cardiovascular: Normal rate, regular rhythm, normal heart sounds and intact distal pulses.   No murmur heard. Pulmonary/Chest: Breath sounds normal. No respiratory distress. She has no wheezes. She has no rales. She exhibits no tenderness.  Musculoskeletal: Normal range of motion. She exhibits tenderness. She exhibits no edema or deformity.       Left shoulder: She exhibits tenderness. She exhibits normal range of motion.       Lumbar back: She exhibits tenderness. She exhibits normal range of motion and no spasm.  Well healed linear surgical scar   Lymphadenopathy:    She has no cervical adenopathy.  Neurological: She is alert and oriented to person, place, and time. Coordination normal.  Skin: Skin is warm and dry. No rash noted. She is not diaphoretic. No erythema. No pallor.  Psychiatric: She has  a normal mood and affect. Her behavior is normal. Judgment and thought content normal.  Nursing note and vitals reviewed.         Assessment & Plan:   1. Healthcare maintenance     Healthcare maintenance  Continue your excellent water intake. Follow heart healthy diet and try to meal prep. Continue walking and increase cardio intensity to aid in weight loss-YouTube/Pinterst vides. Start taking Tumeric daily to help with joint pain, please make follow-up appt in few weeks to discuss various musculoskeletal issues. We will call you when your lab results are available. Recommend annual physical.    FOLLOW-UP:  Return in about 2 weeks (around 01/21/2017) for Musculoskeletal Pain.

## 2017-01-08 LAB — CBC WITH DIFFERENTIAL/PLATELET
BASOS: 0 %
Basophils Absolute: 0 10*3/uL (ref 0.0–0.2)
EOS (ABSOLUTE): 0.1 10*3/uL (ref 0.0–0.4)
Eos: 1 %
Hematocrit: 39.2 % (ref 34.0–46.6)
Hemoglobin: 13.4 g/dL (ref 11.1–15.9)
IMMATURE GRANULOCYTES: 0 %
Immature Grans (Abs): 0 10*3/uL (ref 0.0–0.1)
LYMPHS ABS: 1.7 10*3/uL (ref 0.7–3.1)
Lymphs: 26 %
MCH: 29.8 pg (ref 26.6–33.0)
MCHC: 34.2 g/dL (ref 31.5–35.7)
MCV: 87 fL (ref 79–97)
MONOS ABS: 0.3 10*3/uL (ref 0.1–0.9)
Monocytes: 5 %
Neutrophils Absolute: 4.3 10*3/uL (ref 1.4–7.0)
Neutrophils: 68 %
PLATELETS: 280 10*3/uL (ref 150–379)
RBC: 4.49 x10E6/uL (ref 3.77–5.28)
RDW: 13.4 % (ref 12.3–15.4)
WBC: 6.4 10*3/uL (ref 3.4–10.8)

## 2017-01-08 LAB — LIPID PANEL
CHOLESTEROL TOTAL: 242 mg/dL — AB (ref 100–199)
Chol/HDL Ratio: 4.5 ratio — ABNORMAL HIGH (ref 0.0–4.4)
HDL: 54 mg/dL (ref >39)
LDL Calculated: 155 mg/dL — ABNORMAL HIGH (ref 0–99)
Triglycerides: 165 mg/dL — ABNORMAL HIGH (ref 0–149)
VLDL Cholesterol Cal: 33 mg/dL (ref 5–40)

## 2017-01-08 LAB — COMPREHENSIVE METABOLIC PANEL
A/G RATIO: 2.2 (ref 1.2–2.2)
ALK PHOS: 43 IU/L (ref 39–117)
ALT: 10 IU/L (ref 0–32)
AST: 17 IU/L (ref 0–40)
Albumin: 4.8 g/dL (ref 3.5–5.5)
BILIRUBIN TOTAL: 0.6 mg/dL (ref 0.0–1.2)
BUN/Creatinine Ratio: 13 (ref 9–23)
BUN: 11 mg/dL (ref 6–24)
CHLORIDE: 103 mmol/L (ref 96–106)
CO2: 25 mmol/L (ref 20–29)
Calcium: 9.7 mg/dL (ref 8.7–10.2)
Creatinine, Ser: 0.88 mg/dL (ref 0.57–1.00)
GFR calc Af Amer: 92 mL/min/{1.73_m2} (ref >59)
GFR calc non Af Amer: 80 mL/min/{1.73_m2} (ref >59)
Globulin, Total: 2.2 g/dL (ref 1.5–4.5)
Glucose: 93 mg/dL (ref 65–99)
Potassium: 4.6 mmol/L (ref 3.5–5.2)
SODIUM: 142 mmol/L (ref 134–144)
Total Protein: 7 g/dL (ref 6.0–8.5)

## 2017-01-08 LAB — HEMOGLOBIN A1C
ESTIMATED AVERAGE GLUCOSE: 105 mg/dL
Hgb A1c MFr Bld: 5.3 % (ref 4.8–5.6)

## 2017-01-08 LAB — VITAMIN D 25 HYDROXY (VIT D DEFICIENCY, FRACTURES): VIT D 25 HYDROXY: 32.7 ng/mL (ref 30.0–100.0)

## 2017-01-08 LAB — TSH: TSH: 1.88 u[IU]/mL (ref 0.450–4.500)

## 2017-01-21 ENCOUNTER — Ambulatory Visit (INDEPENDENT_AMBULATORY_CARE_PROVIDER_SITE_OTHER): Payer: BLUE CROSS/BLUE SHIELD | Admitting: Adult Health

## 2017-01-21 ENCOUNTER — Encounter: Payer: Self-pay | Admitting: Adult Health

## 2017-01-21 VITALS — BP 105/69 | HR 85 | Ht 68.0 in | Wt 202.3 lb

## 2017-01-21 DIAGNOSIS — Z0000 Encounter for general adult medical examination without abnormal findings: Secondary | ICD-10-CM

## 2017-01-21 DIAGNOSIS — M545 Low back pain, unspecified: Secondary | ICD-10-CM

## 2017-01-21 DIAGNOSIS — M25512 Pain in left shoulder: Secondary | ICD-10-CM

## 2017-01-21 DIAGNOSIS — G8929 Other chronic pain: Secondary | ICD-10-CM | POA: Diagnosis not present

## 2017-01-21 MED ORDER — VITAMIN D (ERGOCALCIFEROL) 1.25 MG (50000 UNIT) PO CAPS: 50000.0000 [IU] | ORAL_CAPSULE | ORAL | 0 refills | Status: DC

## 2017-01-21 MED ORDER — CYCLOBENZAPRINE HCL 10 MG PO TABS: 10.0000 mg | ORAL_TABLET | Freq: Three times a day (TID) | ORAL | 1 refills | Status: DC | PRN

## 2017-01-21 MED ORDER — TRAMADOL HCL 50 MG PO TABS: 50.0000 mg | ORAL_TABLET | Freq: Three times a day (TID) | ORAL | 0 refills | Status: DC | PRN

## 2017-01-21 NOTE — Progress Notes (Signed)
Subjective:    Patient ID: [name] Orr, female    DOB: April 30, 1971, 46 y.o.   MRN: [id number]  HPI:  Ms. Kozma reports "turning funny at work" then had intense back pain developed and has been intermittent for >4 weeks.  Pain is 2/10 at rest and can increase to 6/76 with certain movements and prolonged walking.  She has been using heating pad, rest, OTC Acetaminophen and RX Cyclobenzaprine with only minor sx relief. She has hx of back fusion surgeries-1994, 1995, 1996, and she has not followed up with neurosurgeon since then. She denies pain radiating in lower extremities, however feels that urinating/defecating can increase the pain.   She also had L shoulder pain with "popping noise" that has been occurring for years.  Patient Care Team    Relationship Specialty Notifications Start End  Mina Marble D, NP PCP - General Family Medicine  01/07/17   Obgyn, Erling Conte    01/07/17     Patient Active Problem List   Diagnosis Date Noted  . Lumbar back pain 01/21/2017  . Chronic left shoulder pain 01/21/2017  . Healthcare maintenance 01/07/2017  . Left flank pain 09/25/2015  . Abnormal CT of liver 09/25/2015  . Renal stones 09/18/2015  . Intractable pain 09/13/2015  . Chest pain, atypical 01/10/2014  . Neck pain 01/10/2014  . Dizziness 01/10/2014  . Fever blister 07/06/2013  . Neck pain, bilateral 02/06/2013  . Depression 10/12/2010  . Migraine aura, persistent 10/12/2010  . Endometriosis 10/10/2010     Past Medical History:  Diagnosis Date  . Anemia    history  . Anxiety    no meds  . Blood in stool    bright red each time has BM for atleast 6 mths  . Chronic back pain greater than 3 months duration   . Depression   . Endometriosis   . Fainting spell   . GERD (gastroesophageal reflux disease)   . Headache(784.0)   . History of chicken pox   . Migraine   . Renal disorder    kidney stones     Past Surgical History:  Procedure Laterality Date  .  ABDOMINAL HYSTERECTOMY    . BACK SURGERY  [phone]   Dr Glenna Fellows  . colonscopy    . DIAGNOSTIC LAPAROSCOPY     x 2  . DILATION AND CURETTAGE OF UTERUS    . KNEE SURGERY  2009   lateral realignment to lt knee  . laproscopy    . svd     x 1     Family History  Problem Relation Age of Onset  . Hyperlipidemia Mother   . Hypertension Mother   . Alcohol abuse Father   . Heart disease Father   . Stroke Father   . Kidney disease Father      History  Drug Use No     History  Alcohol Use  . 0.0 oz/week    Comment: 3-4 glasses of wine per month     History  Smoking Status  . Never Smoker  Smokeless Tobacco  . Never Used     Outpatient Encounter Prescriptions as of 01/21/2017  Medication Sig  . cyclobenzaprine (FLEXERIL) 10 MG tablet Take 1 tablet (10 mg total) by mouth 3 (three) times daily as needed for muscle spasms. Reported on 11/12/2015  . valACYclovir (VALTREX) 1000 MG tablet TAKE TWO TABLETS BY MOUTH TWICE DAILY  . [DISCONTINUED] cyclobenzaprine (FLEXERIL) 10 MG tablet Take 1 tablet by mouth 3 (three) times  daily as needed for muscle spasms. Reported on 11/12/2015  . traMADol (ULTRAM) 50 MG tablet Take 1 tablet (50 mg total) by mouth every 8 (eight) hours as needed.  . Vitamin D, Ergocalciferol, (DRISDOL) 50000 units CAPS capsule Take 1 capsule (50,000 Units total) by mouth every 7 (seven) days.   No facility-administered encounter medications on file as of 01/21/2017.     Allergies: Albuterol and Penicillins  Body mass index is 30.76 kg/m.  Blood pressure 105/69, pulse 85, height 5\' 8"  (1.727 m), weight 202 lb 4.8 oz (91.8 kg), last menstrual period 10/16/2010.    Review of Systems  Constitutional: Positive for activity change and fatigue. Negative for appetite change, chills, diaphoresis and fever.  Respiratory: Negative for cough, chest tightness, shortness of breath, wheezing and stridor.   Cardiovascular: Negative for chest pain, palpitations  and leg swelling.  Gastrointestinal: Negative for abdominal distention, abdominal pain, blood in stool, constipation, diarrhea, nausea and vomiting.  Endocrine: Negative for cold intolerance, heat intolerance, polydipsia, polyphagia and polyuria.  Genitourinary: Negative for difficulty urinating, flank pain and hematuria.  Musculoskeletal: Positive for arthralgias, back pain and gait problem. Negative for joint swelling, neck pain and neck stiffness.  Neurological: Negative for dizziness and headaches.  Hematological: Does not bruise/bleed easily.  Psychiatric/Behavioral: Positive for sleep disturbance.       Objective:   Physical Exam  Constitutional: She is oriented to person, place, and time. She appears well-developed and well-nourished. No distress.  HENT:  Head: Normocephalic and atraumatic.  Right Ear: External ear normal.  Left Ear: External ear normal.  Eyes: Pupils are equal, round, and reactive to light. Conjunctivae are normal.  Cardiovascular: Normal rate, regular rhythm, normal heart sounds and intact distal pulses.   No murmur heard. Pulmonary/Chest: Effort normal and breath sounds normal. No respiratory distress. She has no wheezes. She has no rales. She exhibits no tenderness.  Musculoskeletal: She exhibits tenderness. She exhibits no edema or deformity.       Left shoulder: She exhibits tenderness, bony tenderness, swelling and crepitus. She exhibits normal range of motion, no effusion, normal pulse and normal strength.       Thoracic back: She exhibits tenderness. She exhibits normal range of motion.       Lumbar back: She exhibits tenderness and spasm. She exhibits normal range of motion.  Neurological: She is alert and oriented to person, place, and time. She displays normal reflexes. She exhibits abnormal muscle tone. Coordination normal.  Skin: Skin is warm and dry. No rash noted. She is not diaphoretic. No erythema. No pallor.  Psychiatric: She has a normal mood  and affect. Her behavior is normal. Judgment and thought content normal.  Nursing note and vitals reviewed.         Assessment & Plan:   1. Lumbar back pain   2. Healthcare maintenance   3. Chronic left shoulder pain     Healthcare maintenance Please schedule CPE this fall.  Lumbar back pain Neurosurgery referral placed. [place] Controlled Substance Database Website verified-no contraindications to Tramadol noted. Please perform stretches daily and continue heating pad. Recommend Epson salt bath/soaks. Please take medication as directed.    FOLLOW-UP:  Return in about 3 months (around 04/23/2017) for CPE.

## 2017-01-21 NOTE — Assessment & Plan Note (Signed)
Please schedule CPE this fall.

## 2017-01-21 NOTE — Patient Instructions (Signed)
Back Exercises The following exercises strengthen the muscles that help to support the back. They also help to keep the lower back flexible. Doing these exercises can help to prevent back pain or lessen existing pain. If you have back pain or discomfort, try doing these exercises 2-3 times each day or as told by your health care provider. When the pain goes away, do them once each day, but increase the number of times that you repeat the steps for each exercise (do more repetitions). If you do not have back pain or discomfort, do these exercises once each day or as told by your health care provider. Exercises Single Knee to Chest  Repeat these steps 3-5 times for each leg: 1. Lie on your back on a firm bed or the floor with your legs extended. 2. Bring one knee to your chest. Your other leg should stay extended and in contact with the floor. 3. Hold your knee in place by grabbing your knee or thigh. 4. Pull on your knee until you feel a gentle stretch in your lower back. 5. Hold the stretch for 10-30 seconds. 6. Slowly release and straighten your leg.  Pelvic Tilt  Repeat these steps 5-10 times: 1. Lie on your back on a firm bed or the floor with your legs extended. 2. Bend your knees so they are pointing toward the ceiling and your feet are flat on the floor. 3. Tighten your lower abdominal muscles to press your lower back against the floor. This motion will tilt your pelvis so your tailbone points up toward the ceiling instead of pointing to your feet or the floor. 4. With gentle tension and even breathing, hold this position for 5-10 seconds.  Cat-Cow  Repeat these steps until your lower back becomes more flexible: 1. Get into a hands-and-knees position on a firm surface. Keep your hands under your shoulders, and keep your knees under your hips. You may place padding under your knees for comfort. 2. Let your head hang down, and point your tailbone toward the floor so your lower back  becomes rounded like the back of a cat. 3. Hold this position for 5 seconds. 4. Slowly lift your head and point your tailbone up toward the ceiling so your back forms a sagging arch like the back of a cow. 5. Hold this position for 5 seconds.  Press-Ups  Repeat these steps 5-10 times: 1. Lie on your abdomen (face-down) on the floor. 2. Place your palms near your head, about shoulder-width apart. 3. While you keep your back as relaxed as possible and keep your hips on the floor, slowly straighten your arms to raise the top half of your body and lift your shoulders. Do not use your back muscles to raise your upper torso. You may adjust the placement of your hands to make yourself more comfortable. 4. Hold this position for 5 seconds while you keep your back relaxed. 5. Slowly return to lying flat on the floor.  Bridges  Repeat these steps 10 times: 1. Lie on your back on a firm surface. 2. Bend your knees so they are pointing toward the ceiling and your feet are flat on the floor. 3. Tighten your buttocks muscles and lift your buttocks off of the floor until your waist is at almost the same height as your knees. You should feel the muscles working in your buttocks and the back of your thighs. If you do not feel these muscles, slide your feet 1-2 inches farther away   from your buttocks. 4. Hold this position for 3-5 seconds. 5. Slowly lower your hips to the starting position, and allow your buttocks muscles to relax completely.  If this exercise is too easy, try doing it with your arms crossed over your chest. Abdominal Crunches  Repeat these steps 5-10 times: 1. Lie on your back on a firm bed or the floor with your legs extended. 2. Bend your knees so they are pointing toward the ceiling and your feet are flat on the floor. 3. Cross your arms over your chest. 4. Tip your chin slightly toward your chest without bending your neck. 5. Tighten your abdominal muscles and slowly raise your  trunk (torso) high enough to lift your shoulder blades a tiny bit off of the floor. Avoid raising your torso higher than that, because it can put too much stress on your low back and it does not help to strengthen your abdominal muscles. 6. Slowly return to your starting position.  Back Lifts Repeat these steps 5-10 times: 1. Lie on your abdomen (face-down) with your arms at your sides, and rest your forehead on the floor. 2. Tighten the muscles in your legs and your buttocks. 3. Slowly lift your chest off of the floor while you keep your hips pressed to the floor. Keep the back of your head in line with the curve in your back. Your eyes should be looking at the floor. 4. Hold this position for 3-5 seconds. 5. Slowly return to your starting position.  Contact a health care provider if:  Your back pain or discomfort gets much worse when you do an exercise.  Your back pain or discomfort does not lessen within 2 hours after you exercise. If you have any of these problems, stop doing these exercises right away. Do not do them again unless your health care provider says that you can. Get help right away if:  You develop sudden, severe back pain. If this happens, stop doing the exercises right away. Do not do them again unless your health care provider says that you can. This information is not intended to replace advice given to you by your health care provider. Make sure you discuss any questions you have with your health care provider. Document Released: 06/18/2004 Document Revised: 09/18/2015 Document Reviewed: 07/05/2014 [employer] Interactive Patient Education  2017 [employer]   Shoulder Exercises Ask your health care provider which exercises are safe for you. Do exercises exactly as told by your health care provider and adjust them as directed. It is normal to feel mild stretching, pulling, tightness, or discomfort as you do these exercises, but you should stop right away if you feel  sudden pain or your pain gets worse.Do not begin these exercises until told by your health care provider. RANGE OF MOTION EXERCISES These exercises warm up your muscles and joints and improve the movement and flexibility of your shoulder. These exercises also help to relieve pain, numbness, and tingling. These exercises involve stretching your injured shoulder directly. Exercise A: Pendulum  1. Stand near a wall or a surface that you can hold onto for balance. 2. Bend at the waist and let your left / right arm hang straight down. Use your other arm to support you. Keep your back straight and do not lock your knees. 3. Relax your left / right arm and shoulder muscles, and move your hips and your trunk so your left / right arm swings freely. Your arm should swing because of the motion of  your body, not because you are using your arm or shoulder muscles. 4. Keep moving your body so your arm swings in the following directions, as told by your health care provider: ? Side to side. ? Forward and backward. ? In clockwise and counterclockwise circles. 5. Continue each motion for __________ seconds, or for as long as told by your health care provider. 6. Slowly return to the starting position. Repeat __________ times. Complete this exercise __________ times a day. Exercise B:Flexion, Standing  1. Stand and hold a broomstick, a cane, or a similar object. Place your hands a little more than shoulder-width apart on the object. Your left / right hand should be palm-up, and your other hand should be palm-down. 2. Keep your elbow straight and keep your shoulder muscles relaxed. Push the stick down with your healthy arm to raise your left / right arm in front of your body, and then over your head until you feel a stretch in your shoulder. ? Avoid shrugging your shoulder while you raise your arm. Keep your shoulder blade tucked down toward the middle of your back. 3. Hold for __________ seconds. 4. Slowly  return to the starting position. Repeat __________ times. Complete this exercise __________ times a day. Exercise C: Abduction, Standing 1. Stand and hold a broomstick, a cane, or a similar object. Place your hands a little more than shoulder-width apart on the object. Your left / right hand should be palm-up, and your other hand should be palm-down. 2. While keeping your elbow straight and your shoulder muscles relaxed, push the stick across your body toward your left / right side. Raise your left / right arm to the side of your body and then over your head until you feel a stretch in your shoulder. ? Do not raise your arm above shoulder height, unless your health care provider tells you to do that. ? Avoid shrugging your shoulder while you raise your arm. Keep your shoulder blade tucked down toward the middle of your back. 3. Hold for __________ seconds. 4. Slowly return to the starting position. Repeat __________ times. Complete this exercise __________ times a day. Exercise D:Internal Rotation  1. Place your left / right hand behind your back, palm-up. 2. Use your other hand to dangle an exercise band, a towel, or a similar object over your shoulder. Grasp the band with your left / right hand so you are holding onto both ends. 3. Gently pull up on the band until you feel a stretch in the front of your left / right shoulder. ? Avoid shrugging your shoulder while you raise your arm. Keep your shoulder blade tucked down toward the middle of your back. 4. Hold for __________ seconds. 5. Release the stretch by letting go of the band and lowering your hands. Repeat __________ times. Complete this exercise __________ times a day. STRETCHING EXERCISES These exercises warm up your muscles and joints and improve the movement and flexibility of your shoulder. These exercises also help to relieve pain, numbness, and tingling. These exercises are done using your healthy shoulder to help stretch the  muscles of your injured shoulder. Exercise E: Warehouse manager (External Rotation and Abduction)  1. Stand in a doorway with one of your feet slightly in front of the other. This is called a staggered stance. If you cannot reach your forearms to the door frame, stand facing a corner of a room. 2. Choose one of the following positions as told by your health care provider: ? Place  your hands and forearms on the door frame above your head. ? Place your hands and forearms on the door frame at the height of your head. ? Place your hands on the door frame at the height of your elbows. 3. Slowly move your weight onto your front foot until you feel a stretch across your chest and in the front of your shoulders. Keep your head and chest upright and keep your abdominal muscles tight. 4. Hold for __________ seconds. 5. To release the stretch, shift your weight to your back foot. Repeat __________ times. Complete this stretch __________ times a day. Exercise F:Extension, Standing 1. Stand and hold a broomstick, a cane, or a similar object behind your back. ? Your hands should be a little wider than shoulder-width apart. ? Your palms should face away from your back. 2. Keeping your elbows straight and keeping your shoulder muscles relaxed, move the stick away from your body until you feel a stretch in your shoulder. ? Avoid shrugging your shoulders while you move the stick. Keep your shoulder blade tucked down toward the middle of your back. 3. Hold for __________ seconds. 4. Slowly return to the starting position. Repeat __________ times. Complete this exercise __________ times a day. STRENGTHENING EXERCISES These exercises build strength and endurance in your shoulder. Endurance is the ability to use your muscles for a long time, even after they get tired. Exercise G:External Rotation  6. Sit in a stable chair without armrests. 7. Secure an exercise band at elbow height on your left / right  side. 8. Place a soft object, such as a folded towel or a small pillow, between your left / right upper arm and your body to move your elbow a few inches away (about 10 cm) from your side. 9. Hold the end of the band so it is tight and there is no slack. 10. Keeping your elbow pressed against the soft object, move your left / right forearm out, away from your abdomen. Keep your body steady so only your forearm moves. 11. Hold for __________ seconds. 12. Slowly return to the starting position. Repeat __________ times. Complete this exercise __________ times a day. Exercise H:Shoulder Abduction  1. Sit in a stable chair without armrests, or stand. 2. Hold a __________ weight in your left / right hand, or hold an exercise band with both hands. 3. Start with your arms straight down and your left / right palm facing in, toward your body. 4. Slowly lift your left / right hand out to your side. Do not lift your hand above shoulder height unless your health care provider tells you that this is safe. ? Keep your arms straight. ? Avoid shrugging your shoulder while you do this movement. Keep your shoulder blade tucked down toward the middle of your back. 5. Hold for __________ seconds. 6. Slowly lower your arm, and return to the starting position. Repeat __________ times. Complete this exercise __________ times a day. Exercise I:Shoulder Extension 1. Sit in a stable chair without armrests, or stand. 2. Secure an exercise band to a stable object in front of you where it is at shoulder height. 3. Hold one end of the exercise band in each hand. Your palms should face each other. 4. Straighten your elbows and lift your hands up to shoulder height. 5. Step back, away from the secured end of the exercise band, until the band is tight and there is no slack. 6. Squeeze your shoulder blades together as you pull your  hands down to the sides of your thighs. Stop when your hands are straight down by your sides.  Do not let your hands go behind your body. 7. Hold for __________ seconds. 8. Slowly return to the starting position. Repeat __________ times. Complete this exercise __________ times a day. Exercise J:Standing Shoulder Row 1. Sit in a stable chair without armrests, or stand. 2. Secure an exercise band to a stable object in front of you so it is at waist height. 3. Hold one end of the exercise band in each hand. Your palms should be in a thumbs-up position. 4. Bend each of your elbows to an "L" shape (about 90 degrees) and keep your upper arms at your sides. 5. Step back until the band is tight and there is no slack. 6. Slowly pull your elbows back behind you. 7. Hold for __________ seconds. 8. Slowly return to the starting position. Repeat __________ times. Complete this exercise __________ times a day. Exercise K:Shoulder Press-Ups  1. Sit in a stable chair that has armrests. Sit upright, with your feet flat on the floor. 2. Put your hands on the armrests so your elbows are bent and your fingers are pointing forward. Your hands should be about even with the sides of your body. 3. Push down on the armrests and use your arms to lift yourself off of the chair. Straighten your elbows and lift yourself up as much as you comfortably can. ? Move your shoulder blades down, and avoid letting your shoulders move up toward your ears. ? Keep your feet on the ground. As you get stronger, your feet should support less of your body weight as you lift yourself up. 4. Hold for __________ seconds. 5. Slowly lower yourself back into the chair. Repeat __________ times. Complete this exercise __________ times a day. Exercise L: Wall Push-Ups  1. Stand so you are facing a stable wall. Your feet should be about one arm-length away from the wall. 2. Lean forward and place your palms on the wall at shoulder height. 3. Keep your feet flat on the floor as you bend your elbows and lean forward toward the  wall. 4. Hold for __________ seconds. 5. Straighten your elbows to push yourself back to the starting position. Repeat __________ times. Complete this exercise __________ times a day. This information is not intended to replace advice given to you by your health care provider. Make sure you discuss any questions you have with your health care provider. Document Released: 03/25/2005 Document Revised: 02/03/2016 Document Reviewed: 01/20/2015 [employer] Interactive Patient Education  2018 [employer]   Neurosurgery referral placed. Please perform stretches daily and continue heating pad. Recommend Epson salt bath/soaks. Please take medication as directed. Please schedule CPE this fall. GREAT TO SEE YOU!

## 2017-01-21 NOTE — Assessment & Plan Note (Signed)
Neurosurgery referral placed. [place] Controlled Substance Database Website verified-no contraindications to Tramadol noted. Please perform stretches daily and continue heating pad. Recommend Epson salt bath/soaks. Please take medication as directed.

## 2017-02-12 DIAGNOSIS — M5416 Radiculopathy, lumbar region: Secondary | ICD-10-CM | POA: Diagnosis not present

## 2017-04-20 NOTE — Progress Notes (Signed)
Subjective:    Patient ID: [name] Orr, female    DOB: 01/11/1971, 46 y.o.   MRN: [id number]  HPI:  01/21/17 OV: [name] Orr reports "turning funny at work" then had intense back pain developed and has been intermittent for >4 weeks.  Pain is 2/10 at rest and can increase to 4/09 with certain movements and prolonged walking.  She has been using heating pad, rest, OTC Acetaminophen and RX Cyclobenzaprine with only minor sx relief. She has hx of back fusion surgeries-1994, 1995, 1996, and she has not followed up with neurosurgeon since then. She denies pain radiating in lower extremities, however feels that urinating/defecating can increase the pain.   She also had L shoulder pain with "popping noise" that has been occurring for years.  04/22/17 OV: [name] Orr is here for CPE.  She was unable to complete her MRI this morning due to severe claustrophobia, scan aborted after only a few minutes.  She started a [facility] at [employer]- walks 4-7 miles 3-4 days a week- GREAT! She drinks > gallon water/day and eats a diet rich in vegetables and lean protein, however is unable to loss weight.  She was once on phentermine in the past and tolerated well.  She continues to abstain from tobacco and enjoys wine several nights of the week. Healthcare maintenance: PAP- hx of hysterectomy  Mammogram- to be completed with OB/GYN next month Colonoscopy-not indicated Patient Care Team    Relationship Specialty Notifications Start End  Esaw Grandchild, NP PCP - General Family Medicine  01/07/17   Obgyn, Erling Conte    01/07/17     Patient Active Problem List   Diagnosis Date Noted  . Body mass index (bmi) 31.0-31.9, adult 04/22/2017  . Claustrophobia 04/22/2017  . Lumbar back pain 01/21/2017  . Chronic left shoulder pain 01/21/2017  . Healthcare maintenance 01/07/2017  . Left flank pain 09/25/2015  . Abnormal CT of liver 09/25/2015  . Renal stones 09/18/2015  . Intractable pain  09/13/2015  . Chest pain, atypical 01/10/2014  . Neck pain 01/10/2014  . Dizziness 01/10/2014  . Fever blister 07/06/2013  . Neck pain, bilateral 02/06/2013  . Depression 10/12/2010  . Migraine aura, persistent 10/12/2010  . Endometriosis 10/10/2010     Past Medical History:  Diagnosis Date  . Anemia    history  . Anxiety    no meds  . Blood in stool    bright red each time has BM for atleast 6 mths  . Chronic back pain greater than 3 months duration   . Depression   . Endometriosis   . Fainting spell   . GERD (gastroesophageal reflux disease)   . Headache(784.0)   . History of chicken pox   . Migraine   . Renal disorder    kidney stones     Past Surgical History:  Procedure Laterality Date  . ABDOMINAL HYSTERECTOMY    . BACK SURGERY  [phone]   Dr Glenna Fellows  . colonscopy    . DIAGNOSTIC LAPAROSCOPY     x 2  . DILATION AND CURETTAGE OF UTERUS    . KNEE SURGERY  2009   lateral realignment to lt knee  . laproscopy    . svd     x 1     Family History  Problem Relation Age of Onset  . Hyperlipidemia Mother   . Hypertension Mother   . Alcohol abuse Father   . Heart disease Father   . Stroke Father   .  Kidney disease Father      Social History   Substance and Sexual Activity  Drug Use No     Social History   Substance and Sexual Activity  Alcohol Use Yes  . Alcohol/week: 0.0 oz   Comment: 3-4 glasses of wine per month     Social History   Tobacco Use  Smoking Status Never Smoker  Smokeless Tobacco Never Used     Outpatient Encounter Medications as of 04/22/2017  Medication Sig  . cyclobenzaprine (FLEXERIL) 10 MG tablet Take 1 tablet (10 mg total) by mouth 3 (three) times daily as needed for muscle spasms. Reported on 11/12/2015  . valACYclovir (VALTREX) 1000 MG tablet TAKE TWO TABLETS BY MOUTH TWICE DAILY  . Vitamin D, Ergocalciferol, (DRISDOL) 50000 units CAPS capsule Take 1 capsule (50,000 Units total) by mouth every 7 (seven)  days.  Marland Kitchen LORazepam (ATIVAN) 1 MG tablet Take 1 tablet (1 mg total) by mouth every 8 (eight) hours as needed for anxiety.  . phentermine (ADIPEX-P) 37.5 MG tablet Take 1 tablet (37.5 mg total) by mouth daily before breakfast.  . [DISCONTINUED] traMADol (ULTRAM) 50 MG tablet Take 1 tablet (50 mg total) by mouth every 8 (eight) hours as needed.   No facility-administered encounter medications on file as of 04/22/2017.     Allergies: Albuterol and Penicillins  Body mass index is 31.14 kg/m.  Blood pressure 112/81, pulse 93, height 5\' 8"  (1.727 m), weight 204 lb 12.8 oz (92.9 kg), last menstrual period 10/16/2010.    Review of Systems  Constitutional: Positive for activity change and fatigue. Negative for appetite change, chills, diaphoresis and fever.  Respiratory: Negative for cough, chest tightness, shortness of breath, wheezing and stridor.   Cardiovascular: Negative for chest pain, palpitations and leg swelling.  Gastrointestinal: Negative for abdominal distention, abdominal pain, blood in stool, constipation, diarrhea, nausea and vomiting.  Endocrine: Negative for cold intolerance, heat intolerance, polydipsia, polyphagia and polyuria.  Genitourinary: Negative for difficulty urinating, flank pain and hematuria.  Musculoskeletal: Positive for arthralgias, back pain and gait problem. Negative for joint swelling, neck pain and neck stiffness.  Neurological: Negative for dizziness and headaches.  Hematological: Does not bruise/bleed easily.  Psychiatric/Behavioral: Positive for sleep disturbance.       Objective:   Physical Exam  Constitutional: She is oriented to person, place, and time. She appears well-developed and well-nourished. No distress.  HENT:  Head: Normocephalic and atraumatic.  Right Ear: External ear normal.  Left Ear: External ear normal.  Eyes: Conjunctivae are normal. Pupils are equal, round, and reactive to light.  Neck: Normal range of motion. Neck supple.   Cardiovascular: Normal rate, regular rhythm, normal heart sounds and intact distal pulses.  No murmur heard. Pulmonary/Chest: Effort normal and breath sounds normal. No respiratory distress. She has no wheezes. She has no rales. She exhibits no tenderness. Right breast exhibits no mass and no tenderness. Left breast exhibits tenderness. Left breast exhibits no mass.  Abdominal: Soft. Bowel sounds are normal. She exhibits no distension and no mass. There is no tenderness. There is no rebound and no guarding.  Musculoskeletal: She exhibits tenderness. She exhibits no edema or deformity.       Left shoulder: She exhibits tenderness, bony tenderness, swelling and crepitus. She exhibits normal range of motion, no effusion, normal pulse and normal strength.       Thoracic back: She exhibits tenderness. She exhibits normal range of motion.       Lumbar back: She exhibits tenderness and spasm.  She exhibits normal range of motion.  Lymphadenopathy:    She has no cervical adenopathy.  Neurological: She is alert and oriented to person, place, and time. She displays normal reflexes. She exhibits abnormal muscle tone. Coordination normal.  Skin: Skin is warm and dry. No rash noted. She is not diaphoretic. No erythema. No pallor.  Psychiatric: She has a normal mood and affect. Her behavior is normal. Judgment and thought content normal.  Nursing note and vitals reviewed.         Assessment & Plan:   1. Need for Tdap vaccination   2. Healthcare maintenance   3. Body mass index (bmi) 31.0-31.9, adult   4. Claustrophobia     Body mass index (bmi) 31.0-31.9, adult [place] Controlled Substance Database reviewed-no contraindications noted. Started on phentermine  Continue regular exercise, excellent water intake, and healthy eating. F/u 4 weeks- BP and wt check  [place] Controlled Substance Database reviewed-no contraindications noted. Ativan rx provided for upcoming  MRI  Healthcare maintenance Please take medications as directed. Increase water intake, strive for at least 105 ounces/day.   Follow Heart Healthy diet Continue regular exercise- GREAT JOB! Follow-up in one month for medical weight loss.    FOLLOW-UP:  Return in about 4 weeks (around 05/20/2017) for Evaluate Medication Effectiveness, Medical Weight Loss.

## 2017-04-22 ENCOUNTER — Encounter: Payer: Self-pay | Admitting: Adult Health

## 2017-04-22 ENCOUNTER — Ambulatory Visit (INDEPENDENT_AMBULATORY_CARE_PROVIDER_SITE_OTHER): Payer: BLUE CROSS/BLUE SHIELD | Admitting: Adult Health

## 2017-04-22 VITALS — BP 112/81 | HR 93 | Ht 68.0 in | Wt 204.8 lb

## 2017-04-22 DIAGNOSIS — Z6831 Body mass index (BMI) 31.0-31.9, adult: Secondary | ICD-10-CM | POA: Insufficient documentation

## 2017-04-22 DIAGNOSIS — Z23 Encounter for immunization: Secondary | ICD-10-CM | POA: Diagnosis not present

## 2017-04-22 DIAGNOSIS — F40240 Claustrophobia: Secondary | ICD-10-CM | POA: Insufficient documentation

## 2017-04-22 DIAGNOSIS — Z0000 Encounter for general adult medical examination without abnormal findings: Secondary | ICD-10-CM | POA: Diagnosis not present

## 2017-04-22 MED ORDER — PHENTERMINE HCL 37.5 MG PO TABS: 37.5000 mg | ORAL_TABLET | Freq: Every day | ORAL | 0 refills | Status: DC

## 2017-04-22 MED ORDER — LORAZEPAM 1 MG PO TABS: 1.0000 mg | ORAL_TABLET | Freq: Three times a day (TID) | ORAL | 0 refills | Status: DC | PRN

## 2017-04-22 NOTE — Patient Instructions (Signed)

## 2017-04-22 NOTE — Assessment & Plan Note (Signed)
Please take medications as directed. Increase water intake, strive for at least 105 ounces/day.   Follow Heart Healthy diet Continue regular exercise- GREAT JOB! Follow-up in one month for medical weight loss.

## 2017-04-22 NOTE — Assessment & Plan Note (Addendum)
[place] Controlled Substance Database reviewed-no contraindications noted. Started on phentermine  Continue regular exercise, excellent water intake, and healthy eating. F/u 4 weeks- BP and wt check

## 2017-04-22 NOTE — Assessment & Plan Note (Signed)
[place] Controlled Substance Database reviewed-no contraindications noted. Ativan rx provided for upcoming MRI

## 2017-04-26 ENCOUNTER — Other Ambulatory Visit: Payer: Self-pay | Admitting: Neurosurgery

## 2017-04-26 DIAGNOSIS — M5416 Radiculopathy, lumbar region: Secondary | ICD-10-CM

## 2017-04-27 ENCOUNTER — Other Ambulatory Visit: Payer: Self-pay | Admitting: Adult Health

## 2017-05-04 ENCOUNTER — Other Ambulatory Visit: Payer: BLUE CROSS/BLUE SHIELD

## 2017-05-12 ENCOUNTER — Other Ambulatory Visit: Payer: Self-pay

## 2017-05-12 MED ORDER — VALACYCLOVIR HCL 1 G PO TABS: 2000.0000 mg | ORAL_TABLET | Freq: Two times a day (BID) | ORAL | 2 refills | Status: DC
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
## 2017-05-20 ENCOUNTER — Encounter: Payer: Self-pay | Admitting: Adult Health

## 2017-05-20 ENCOUNTER — Ambulatory Visit (INDEPENDENT_AMBULATORY_CARE_PROVIDER_SITE_OTHER): Payer: BLUE CROSS/BLUE SHIELD | Admitting: Adult Health

## 2017-05-20 VITALS — BP 122/76 | HR 83 | Ht 68.0 in | Wt 203.4 lb

## 2017-05-20 DIAGNOSIS — R229 Localized swelling, mass and lump, unspecified: Secondary | ICD-10-CM | POA: Diagnosis not present

## 2017-05-20 DIAGNOSIS — M898X1 Other specified disorders of bone, shoulder: Secondary | ICD-10-CM

## 2017-05-20 DIAGNOSIS — G43909 Migraine, unspecified, not intractable, without status migrainosus: Secondary | ICD-10-CM | POA: Diagnosis not present

## 2017-05-20 DIAGNOSIS — R0789 Other chest pain: Secondary | ICD-10-CM | POA: Diagnosis not present

## 2017-05-20 DIAGNOSIS — E559 Vitamin D deficiency, unspecified: Secondary | ICD-10-CM | POA: Diagnosis not present

## 2017-05-20 MED ORDER — PREDNISONE 20 MG PO TABS: ORAL_TABLET | ORAL | 0 refills | Status: DC

## 2017-05-20 MED ORDER — HYDROCODONE-ACETAMINOPHEN 5-300 MG PO TABS: 1.0000 | ORAL_TABLET | Freq: Four times a day (QID) | ORAL | 0 refills | Status: DC | PRN

## 2017-05-20 NOTE — Assessment & Plan Note (Signed)
[place] of Left neck order placed. Apply ice to swollen clavicle area for 84mins every 3 hrs, do not place ice directly on skin. Please take medications as directed. Use OTC Acetaminophen for pain control during the daytime.  Follow-up in 4 weeks.

## 2017-05-20 NOTE — Assessment & Plan Note (Signed)
Neurology referral placed

## 2017-05-20 NOTE — Assessment & Plan Note (Signed)
[place] of Left neck order placed. Apply ice to swollen clavicle area for 28mins every 3 hrs, do not place ice directly on skin. Please take medications as directed. Use OTC Acetaminophen for pain control during the daytime.  Follow-up in 4 weeks.

## 2017-05-20 NOTE — Assessment & Plan Note (Signed)
EKG Normal.

## 2017-05-20 NOTE — Patient Instructions (Signed)
Migraine Headache A migraine headache is an intense, throbbing pain on one side or both sides of the head. Migraines may also cause other symptoms, such as nausea, vomiting, and sensitivity to light and noise. What are the causes? Doing or taking certain things may also trigger migraines, such as:  Alcohol.  Smoking.  Medicines, such as: ? Medicine used to treat chest pain (nitroglycerine). ? Birth control pills. ? Estrogen pills. ? Certain blood pressure medicines.  Aged cheeses, chocolate, or caffeine.  Foods or drinks that contain nitrates, glutamate, aspartame, or tyramine.  Physical activity.  Other things that may trigger a migraine include:  Menstruation.  Pregnancy.  Hunger.  Stress, lack of sleep, too much sleep, or fatigue.  Weather changes.  What increases the risk? The following factors may make you more likely to experience migraine headaches:  Age. Risk increases with age.  Family history of migraine headaches.  Being Caucasian.  Depression and anxiety.  Obesity.  Being a woman.  Having a hole in the heart (patent foramen ovale) or other heart problems.  What are the signs or symptoms? The main symptom of this condition is pulsating or throbbing pain. Pain may:  Happen in any area of the head, such as on one side or both sides.  Interfere with daily activities.  Get worse with physical activity.  Get worse with exposure to bright lights or loud noises.  Other symptoms may include:  Nausea.  Vomiting.  Dizziness.  General sensitivity to bright lights, loud noises, or smells.  Before you get a migraine, you may get warning signs that a migraine is developing (aura). An aura may include:  Seeing flashing lights or having blind spots.  Seeing bright spots, halos, or zigzag lines.  Having tunnel vision or blurred vision.  Having numbness or a tingling feeling.  Having trouble talking.  Having muscle weakness.  How is this  diagnosed? A migraine headache can be diagnosed based on:  Your symptoms.  A physical exam.  Tests, such as CT scan or MRI of the head. These imaging tests can help rule out other causes of headaches.  Taking fluid from the spine (lumbar puncture) and analyzing it (cerebrospinal fluid analysis, or CSF analysis).  How is this treated? A migraine headache is usually treated with medicines that:  Relieve pain.  Relieve nausea.  Prevent migraines from coming back.  Treatment may also include:  Acupuncture.  Lifestyle changes like avoiding foods that trigger migraines.  Follow these instructions at home: Medicines  Take over-the-counter and prescription medicines only as told by your health care provider.  Do not drive or use heavy machinery while taking prescription pain medicine.  To prevent or treat constipation while you are taking prescription pain medicine, your health care provider may recommend that you: ? Drink enough fluid to keep your urine clear or pale yellow. ? Take over-the-counter or prescription medicines. ? Eat foods that are high in fiber, such as fresh fruits and vegetables, whole grains, and beans. ? Limit foods that are high in fat and processed sugars, such as fried and sweet foods. Lifestyle  Avoid alcohol use.  Do not use any products that contain nicotine or tobacco, such as cigarettes and e-cigarettes. If you need help quitting, ask your health care provider.  Get at least 8 hours of sleep every night.  Limit your stress. General instructions   Keep a journal to find out what may trigger your migraine headaches. For example, write down: ? What you eat and   drink. ? How much sleep you get. ? Any change to your diet or medicines.  If you have a migraine: ? Avoid things that make your symptoms worse, such as bright lights. ? It may help to lie down in a dark, quiet room. ? Do not drive or use heavy machinery. ? Ask your health care provider  what activities are safe for you while you are experiencing symptoms.  Keep all follow-up visits as told by your health care provider. This is important. Contact a health care provider if:  You develop symptoms that are different or more severe than your usual migraine symptoms. Get help right away if:  Your migraine becomes severe.  You have a fever.  You have a stiff neck.  You have vision loss.  Your muscles feel weak or like you cannot control them.  You start to lose your balance often.  You develop trouble walking.  You faint. This information is not intended to replace advice given to you by your health care provider. Make sure you discuss any questions you have with your health care provider. Document Released: 05/11/2005 Document Revised: 11/29/2015 Document Reviewed: 10/28/2015 Elsevier Interactive Patient Education  2017 [employer]   Cervical Radiculopathy Cervical radiculopathy happens when a nerve in the neck (cervical nerve) is pinched or bruised. This condition can develop because of an injury or as part of the normal aging process. Pressure on the cervical nerves can cause pain or numbness that runs from the neck all the way down into the arm and fingers. Usually, this condition gets better with rest. Treatment may be needed if the condition does not improve. What are the causes? This condition may be caused by:  Injury.  Slipped (herniated) disk.  Muscle tightness in the neck because of overuse.  Arthritis.  Breakdown or degeneration in the bones and joints of the spine (spondylosis) due to aging.  Bone spurs that may develop near the cervical nerves.  What are the signs or symptoms? Symptoms of this condition include:  Pain that runs from the neck to the arm and hand. The pain can be severe or irritating. It may be worse when the neck is moved.  Numbness or weakness in the affected arm and hand.  How is this diagnosed? This condition may be  diagnosed based on symptoms, medical history, and a physical exam. You may also have tests, including:  X-rays.  CT scan.  MRI.  Electromyogram (EMG).  Nerve conduction tests.  How is this treated? In many cases, treatment is not needed for this condition. With rest, the condition usually gets better over time. If treatment is needed, options may include:  Wearing a soft neck collar for short periods of time.  Physical therapy to strengthen your neck muscles.  Medicines, such as NSAIDs, oral corticosteroids, or spinal injections.  Surgery. This may be needed if other treatments do not help. Various types of surgery may be done depending on the cause of your problems.  Follow these instructions at home: Managing pain  Take over-the-counter and prescription medicines only as told by your health care provider.  If directed, apply ice to the affected area. ? Put ice in a plastic bag. ? Place a towel between your skin and the bag. ? Leave the ice on for 20 minutes, 2-3 times per day.  If ice does not help, you can try using heat. Take a warm shower or warm bath, or use a heat pack as told by your health care  provider.  Try a gentle neck and shoulder massage to help relieve symptoms. Activity  Rest as needed. Follow instructions from your health care provider about any restrictions on activities.  Do stretching and strengthening exercises as told by your health care provider or physical therapist. General instructions  If you were given a soft collar, wear it as told by your health care provider.  Use a flat pillow when you sleep.  Keep all follow-up visits as told by your health care provider. This is important. Contact a health care provider if:  Your condition does not improve with treatment. Get help right away if:  Your pain gets much worse and cannot be controlled with medicines.  You have weakness or numbness in your hand, arm, face, or leg.  You have a high  fever.  You have a stiff, rigid neck.  You lose control of your bowels or your bladder (have incontinence).  You have trouble with walking, balance, or speaking. This information is not intended to replace advice given to you by your health care provider. Make sure you discuss any questions you have with your health care provider. Document Released: 02/03/2001 Document Revised: 10/17/2015 Document Reviewed: 07/05/2014 Elsevier Interactive Patient Education  2018 [employer].  EKG- Normal [place] of Left neck order placed. Neurology referral placed. Apply ice to swollen clavicle area for 64mins every 3 hrs, do not place ice directly on skin. Please take medications as directed. Use OTC Acetaminophen for pain control during the daytime.  Follow-up in 4 weeks. FEEL BETTER!

## 2017-05-21 NOTE — Addendum Note (Signed)
Addended by: Fonnie Mu on: 05/21/2017 07:57 AM   Modules accepted: Orders

## 2017-05-24 ENCOUNTER — Ambulatory Visit
Admission: RE | Admit: 2017-05-24 | Discharge: 2017-05-24 | Disposition: A | Discharge status: Home / Self Care | Payer: BLUE CROSS/BLUE SHIELD | Source: Ambulatory Visit | Attending: Adult Health | Admitting: Adult Health

## 2017-05-24 DIAGNOSIS — R229 Localized swelling, mass and lump, unspecified: Secondary | ICD-10-CM

## 2017-05-24 DIAGNOSIS — R221 Localized swelling, mass and lump, neck: Secondary | ICD-10-CM | POA: Diagnosis not present

## 2017-05-24 DIAGNOSIS — M898X1 Other specified disorders of bone, shoulder: Secondary | ICD-10-CM

## 2017-05-26 ENCOUNTER — Other Ambulatory Visit: Payer: Self-pay

## 2017-05-26 MED ORDER — VITAMIN D (ERGOCALCIFEROL) 1.25 MG (50000 UNIT) PO CAPS: 50000.0000 [IU] | ORAL_CAPSULE | ORAL | 0 refills | Status: DC

## 2017-06-03 ENCOUNTER — Ambulatory Visit
Admission: RE | Admit: 2017-06-03 | Discharge: 2017-06-03 | Disposition: A | Discharge status: Home / Self Care | Payer: BLUE CROSS/BLUE SHIELD | Source: Ambulatory Visit | Attending: Neurosurgery | Admitting: Neurosurgery

## 2017-06-03 DIAGNOSIS — M5126 Other intervertebral disc displacement, lumbar region: Secondary | ICD-10-CM | POA: Diagnosis not present

## 2017-06-03 DIAGNOSIS — M5416 Radiculopathy, lumbar region: Secondary | ICD-10-CM

## 2017-06-03 MED ORDER — GADOBENATE DIMEGLUMINE 529 MG/ML IV SOLN
19.0000 mL | Freq: Once | INTRAVENOUS | Status: AC | PRN
  Administered 2017-06-03: 19 mL via INTRAVENOUS

## 2017-06-04 DIAGNOSIS — Z6830 Body mass index (BMI) 30.0-30.9, adult: Secondary | ICD-10-CM | POA: Diagnosis not present

## 2017-06-04 DIAGNOSIS — Z1231 Encounter for screening mammogram for malignant neoplasm of breast: Secondary | ICD-10-CM | POA: Diagnosis not present

## 2017-06-04 DIAGNOSIS — Z01419 Encounter for gynecological examination (general) (routine) without abnormal findings: Secondary | ICD-10-CM | POA: Diagnosis not present

## 2017-06-09 DIAGNOSIS — M5416 Radiculopathy, lumbar region: Secondary | ICD-10-CM | POA: Diagnosis not present

## 2017-06-15 DIAGNOSIS — M961 Postlaminectomy syndrome, not elsewhere classified: Secondary | ICD-10-CM | POA: Diagnosis not present

## 2017-06-15 DIAGNOSIS — M7918 Myalgia, other site: Secondary | ICD-10-CM | POA: Diagnosis not present

## 2017-06-15 DIAGNOSIS — M5136 Other intervertebral disc degeneration, lumbar region: Secondary | ICD-10-CM | POA: Diagnosis not present

## 2017-06-15 DIAGNOSIS — M47816 Spondylosis without myelopathy or radiculopathy, lumbar region: Secondary | ICD-10-CM | POA: Diagnosis not present
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
## 2017-06-17 ENCOUNTER — Ambulatory Visit: Payer: BLUE CROSS/BLUE SHIELD | Admitting: Adult Health

## 2017-07-15 ENCOUNTER — Ambulatory Visit: Payer: Self-pay | Admitting: Neurology

## 2017-09-22 ENCOUNTER — Ambulatory Visit (INDEPENDENT_AMBULATORY_CARE_PROVIDER_SITE_OTHER): Payer: BLUE CROSS/BLUE SHIELD | Admitting: Adult Health

## 2017-09-22 ENCOUNTER — Encounter: Payer: Self-pay | Admitting: Adult Health

## 2017-09-22 VITALS — BP 116/81 | HR 79 | Temp 98.7°F | Ht 68.0 in | Wt 193.9 lb

## 2017-09-22 DIAGNOSIS — J029 Acute pharyngitis, unspecified: Secondary | ICD-10-CM | POA: Diagnosis not present

## 2017-09-22 MED ORDER — PREDNISONE 20 MG PO TABS: 20.0000 mg | ORAL_TABLET | Freq: Every day | ORAL | 0 refills | Status: DC

## 2017-09-22 MED ORDER — AZITHROMYCIN 250 MG PO TABS: ORAL_TABLET | ORAL | 0 refills | Status: DC

## 2017-09-22 NOTE — Progress Notes (Signed)
Subjective:    Patient ID: [name] Orr, female    DOB: [birth year], 47 y.o.   MRN: [id number]  HPI:  [name] Orr presents with extreme sore throat (8/10, R ear pain (6/10), frontal HA (6/10) that all suddenly began yesterday. She also reports feeling "feverish with chills", however she never took her temp. She was around young children and swam in hotel pool over the weekend. She denies CP/dyspnea/cough/palpitations/N/V/D. She has had strep in the past and reports this feeling similar in nature. She reports scant clear nasal drainage and poor appetite that last few days.  Patient Care Team    Relationship Specialty Notifications Start End  Mina Marble D, NP PCP - General Family Medicine  01/07/17   Obgyn, Erling Conte    01/07/17     Patient Active Problem List   Diagnosis Date Noted  . Pharyngitis 09/22/2017  . Soft tissue swelling 05/20/2017  . Pain of left clavicle 05/20/2017  . Migraine without status migrainosus, not intractable 05/20/2017  . Body mass index (bmi) 31.0-31.9, adult 04/22/2017  . Claustrophobia 04/22/2017  . Lumbar back pain 01/21/2017  . Chronic left shoulder pain 01/21/2017  . Healthcare maintenance 01/07/2017  . Left flank pain 09/25/2015  . Abnormal CT of liver 09/25/2015  . Renal stones 09/18/2015  . Intractable pain 09/13/2015  . Chest pain, atypical 01/10/2014  . Neck pain 01/10/2014  . Dizziness 01/10/2014  . Fever blister 07/06/2013  . Neck pain, bilateral 02/06/2013  . Depression 10/12/2010  . Migraine aura, persistent 10/12/2010  . Endometriosis 10/10/2010     Past Medical History:  Diagnosis Date  . Anemia    history  . Anxiety    no meds  . Blood in stool    bright red each time has BM for atleast 6 mths  . Chronic back pain greater than 3 months duration   . Depression   . Endometriosis   . Fainting spell   . GERD (gastroesophageal reflux disease)   . Headache(784.0)   . History of chicken pox   . Migraine   . Renal  disorder    kidney stones     Past Surgical History:  Procedure Laterality Date  . ABDOMINAL HYSTERECTOMY    . BACK SURGERY  [phone]   Dr Glenna Fellows  . colonscopy    . DIAGNOSTIC LAPAROSCOPY     x 2  . DILATION AND CURETTAGE OF UTERUS    . KNEE SURGERY  2009   lateral realignment to lt knee  . laproscopy    . svd     x 1     Family History  Problem Relation Age of Onset  . Hyperlipidemia Mother   . Hypertension Mother   . Alcohol abuse Father   . Heart disease Father   . Stroke Father   . Kidney disease Father      Social History   Substance and Sexual Activity  Drug Use No     Social History   Substance and Sexual Activity  Alcohol Use Yes  . Alcohol/week: 0.0 oz   Comment: 3-4 glasses of wine per month     Social History   Tobacco Use  Smoking Status Never Smoker  Smokeless Tobacco Never Used     Outpatient Encounter Medications as of 09/22/2017  Medication Sig  . cyclobenzaprine (FLEXERIL) 10 MG tablet Take 1 tablet (10 mg total) by mouth 3 (three) times daily as needed for muscle spasms. Reported on 11/12/2015  . LORazepam (ATIVAN) 1  MG tablet Take 1 tablet (1 mg total) by mouth every 8 (eight) hours as needed for anxiety.  . phentermine (ADIPEX-P) 37.5 MG tablet Take 1 tablet (37.5 mg total) by mouth daily before breakfast.  . valACYclovir (VALTREX) 1000 MG tablet Take 2 tablets (2,000 mg total) by mouth 2 (two) times daily.  . Vitamin D, Ergocalciferol, (DRISDOL) 50000 units CAPS capsule Take 1 capsule (50,000 Units total) by mouth every 7 (seven) days.  Marland Kitchen azithromycin (ZITHROMAX) 250 MG tablet 2 tabs day one.  1 tab days 2-5  . predniSONE (DELTASONE) 20 MG tablet Take 1 tablet (20 mg total) by mouth daily with breakfast. 1 tab every 12 hrs for three days.  1 tab daily for three days.  . [DISCONTINUED] Hydrocodone-Acetaminophen 5-300 MG TABS Take 1 tablet by mouth 4 (four) times daily as needed.  . [DISCONTINUED] predniSONE (DELTASONE) 20 MG  tablet 1 tab every 12 hrs for first 3 days, then 1 tab daily for 3 days   No facility-administered encounter medications on file as of 09/22/2017.     Allergies: Albuterol and Penicillins  Body mass index is 29.48 kg/m.  Blood pressure 116/81, pulse 79, temperature 98.7 F (37.1 C), temperature source Oral, height 5\' 8"  (1.727 m), weight 193 lb 14.4 oz (88 kg), last menstrual period 10/16/2010, SpO2 98 %.  Review of Systems  Constitutional: Positive for activity change, appetite change, chills, fatigue and fever. Negative for diaphoresis and unexpected weight change.  HENT: Positive for congestion, ear pain, sore throat and voice change. Negative for ear discharge, facial swelling, postnasal drip, sinus pressure, sinus pain, sneezing and trouble swallowing.   Eyes: Negative for visual disturbance.  Respiratory: Negative for cough, chest tightness, shortness of breath, wheezing and stridor.   Cardiovascular: Negative for chest pain, palpitations and leg swelling.  Gastrointestinal: Negative for abdominal distention, abdominal pain, blood in stool, constipation, diarrhea, nausea and vomiting.  Endocrine: Negative for cold intolerance, heat intolerance, polydipsia, polyphagia and polyuria.  Genitourinary: Negative for difficulty urinating and flank pain.  Skin: Negative for color change, pallor, rash and wound.  Neurological: Positive for headaches. Negative for dizziness.  Hematological: Does not bruise/bleed easily.       Objective:   Physical Exam  Constitutional: She is oriented to person, place, and time. She appears well-developed and well-nourished. She appears ill.  HENT:  Head: Normocephalic and atraumatic.  Right Ear: Hearing, tympanic membrane and ear canal normal. No drainage.  Left Ear: Hearing, tympanic membrane and ear canal normal. No drainage.  Mouth/Throat: Mucous membranes are normal. Mucous membranes are not pale and not cyanotic. Posterior oropharyngeal edema and  posterior oropharyngeal erythema present. No oropharyngeal exudate or tonsillar abscesses. Tonsils are 1+ on the right. Tonsils are 1+ on the left. Tonsillar exudate.  Eyes: Pupils are equal, round, and reactive to light. EOM are normal.  Neck: Normal range of motion. Neck supple.  Cardiovascular: Normal rate, regular rhythm, normal heart sounds and intact distal pulses.  No murmur heard. Pulmonary/Chest: Effort normal and breath sounds normal. No stridor. No respiratory distress. She has no wheezes. She has no rhonchi. She has no rales. She exhibits no tenderness.  Lymphadenopathy:    She has no cervical adenopathy.  Neurological: She is alert and oriented to person, place, and time.  Psychiatric: Her behavior is normal.  Nursing note and vitals reviewed.     Assessment & Plan:   1. Pharyngitis, unspecified etiology     Pharyngitis Please take Azithromycin and Prednisone as directed. Increase  fluids/rest/vit c-2,000mg /day when not feeling well. Alternate OTC Acetaminophen and Ibuprofen as needed for pain. If symptoms persist after antibiotic completed, then please call clinic.  FOLLOW-UP:  Return if symptoms worsen or fail to improve.

## 2017-09-22 NOTE — Assessment & Plan Note (Signed)
Please take Azithromycin and Prednisone as directed. Increase fluids/rest/vit c-2,000mg /day when not feeling well. Alternate OTC Acetaminophen and Ibuprofen as needed for pain. If symptoms persist after antibiotic completed, then please call clinic.

## 2017-09-22 NOTE — Patient Instructions (Signed)

## 2017-12-08 DIAGNOSIS — R1032 Left lower quadrant pain: Secondary | ICD-10-CM | POA: Diagnosis not present

## 2017-12-28 IMAGING — CT CT RENAL STONE PROTOCOL
1 of 2 series · 15 of 32 positions shown, 19 images · non-contrast
Comparison: None.

CLINICAL DATA: Sudden onset right lower abdominal pain with nausea.

EXAM:
CT ABDOMEN AND PELVIS WITHOUT CONTRAST
TECHNIQUE: Multidetector CT imaging of the abdomen and pelvis was performed
following the standard protocol without IV contrast.

[Series 2: stone standard full · axial · 0.82mm/px · z∈[-854,-418]mm · 15 of 97 slices shown, 19 images]
[im 5/97  soft-tissue]
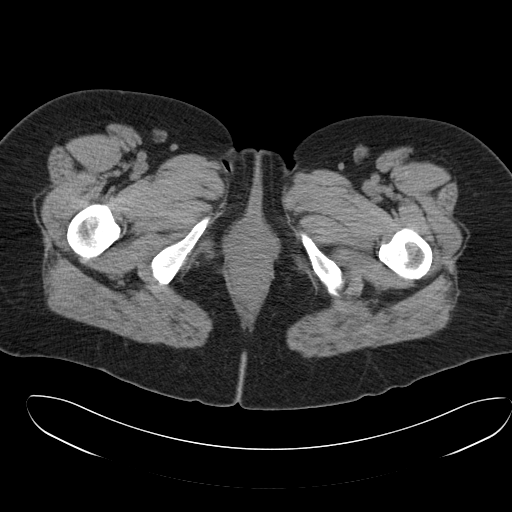
[im 5/97  bone]
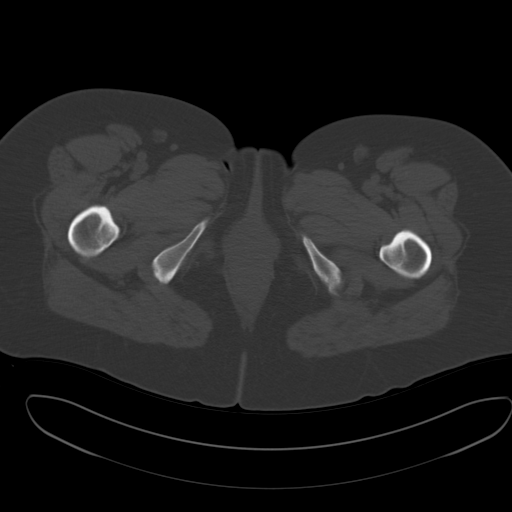
[im 13/97  soft-tissue]
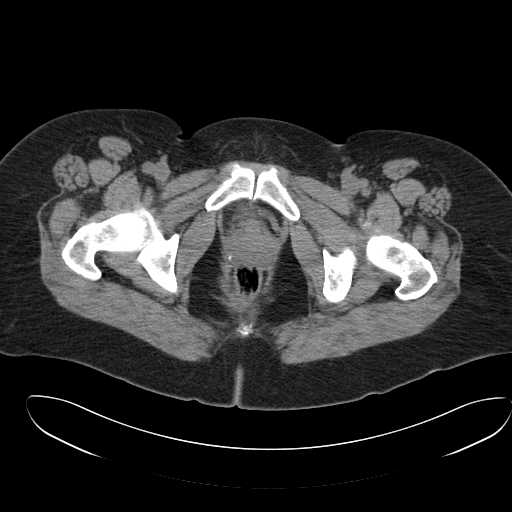
[im 21/97  soft-tissue]
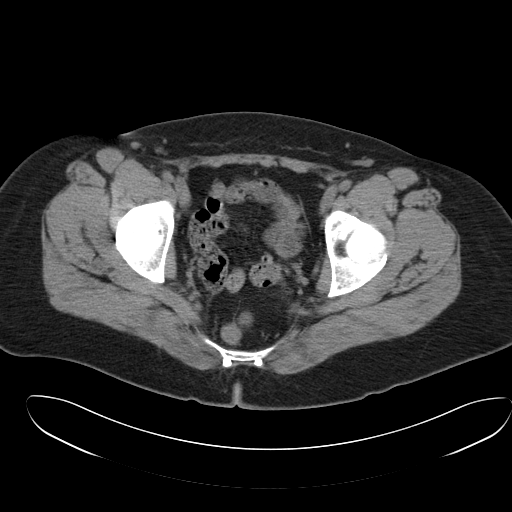
[im 26/97  soft-tissue]
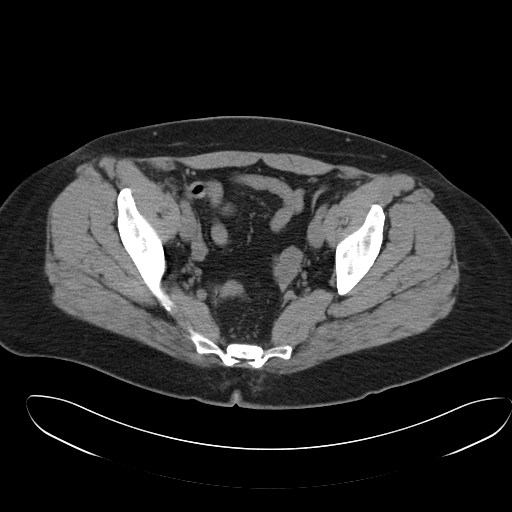
[im 34/97  soft-tissue]
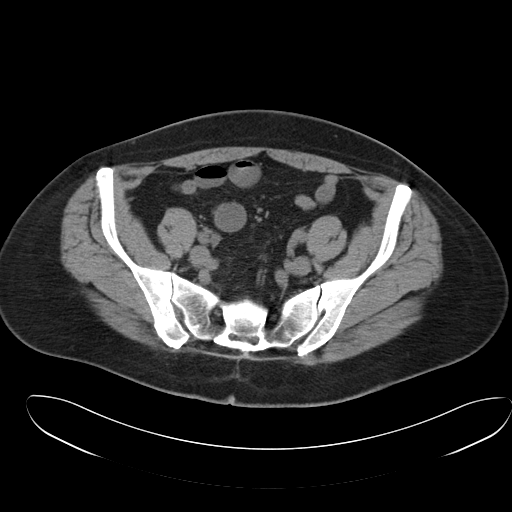
[im 42/97  soft-tissue]
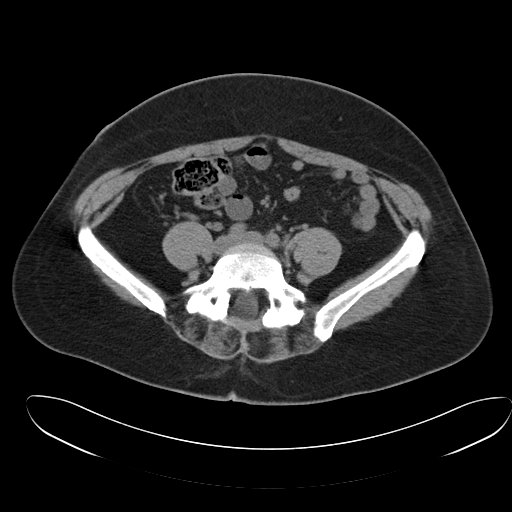
[im 51/97  soft-tissue]
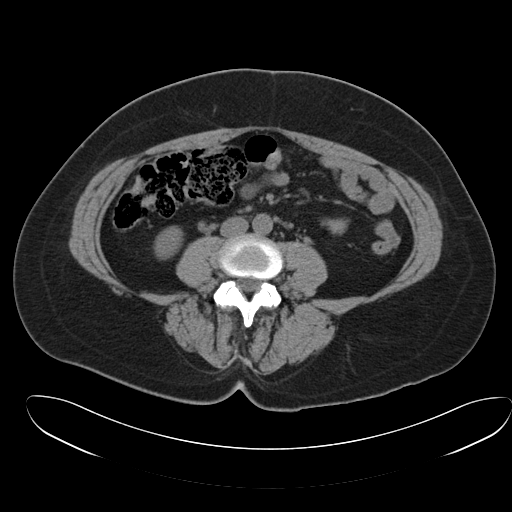
[im 55/97  soft-tissue]
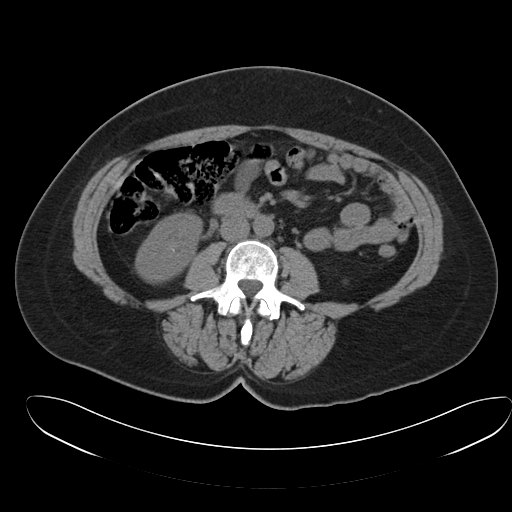
[im 63/97  soft-tissue]
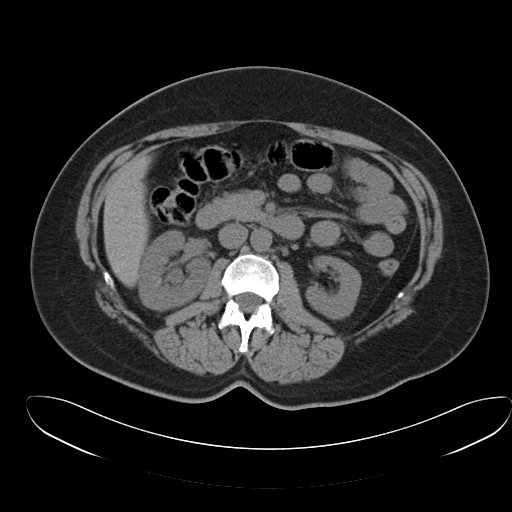
[im 63/97  bone]
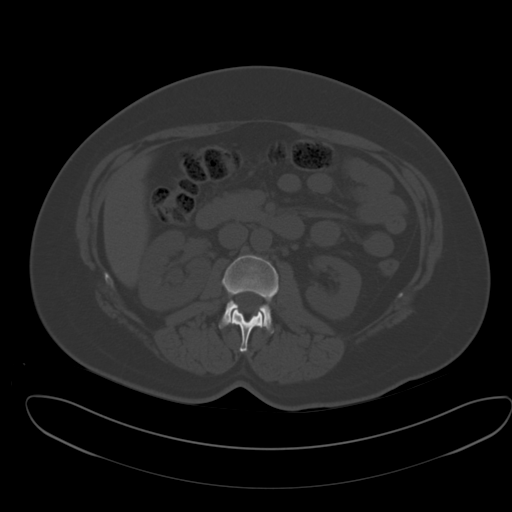
[im 71/97  soft-tissue]
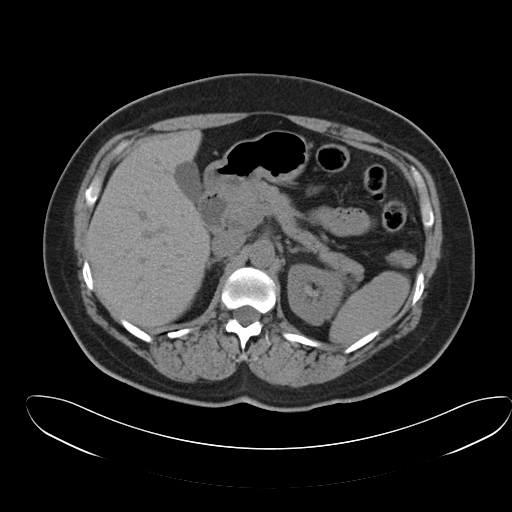
[im 76/97  soft-tissue]
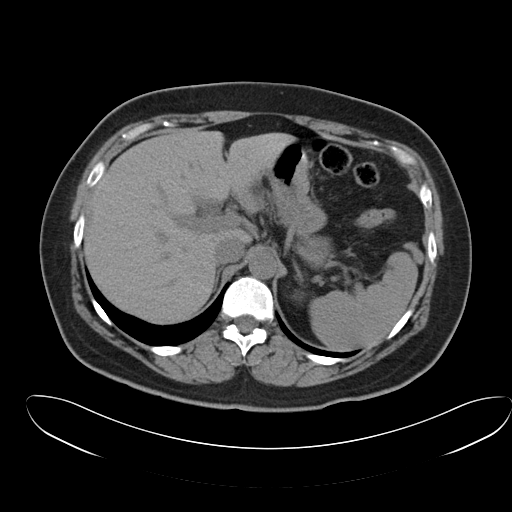
[im 80/97  lung]
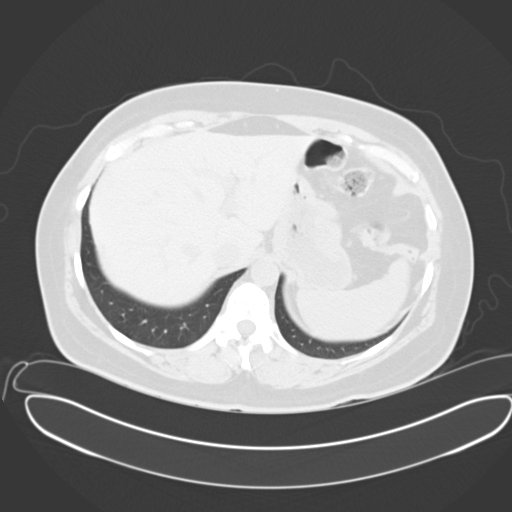
[im 84/97  soft-tissue]
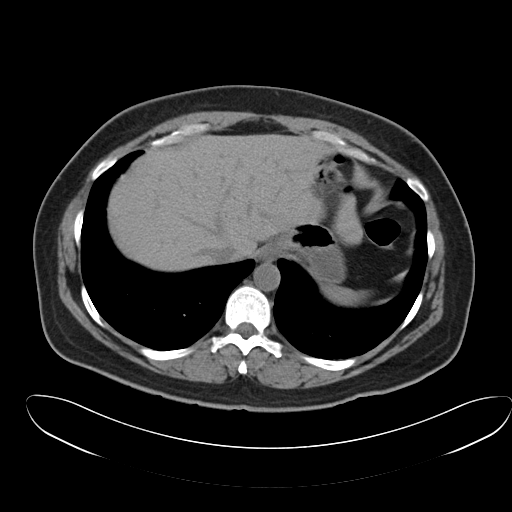
[im 84/97  lung]
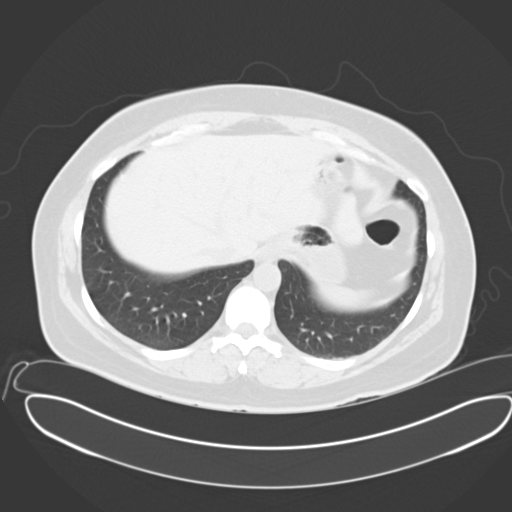
[im 88/97  lung]
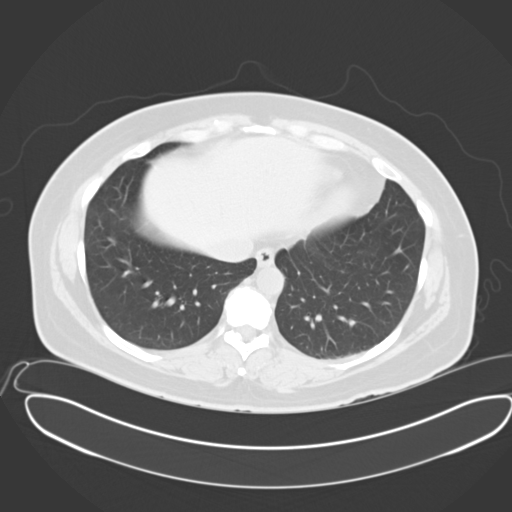
[im 92/97  soft-tissue]
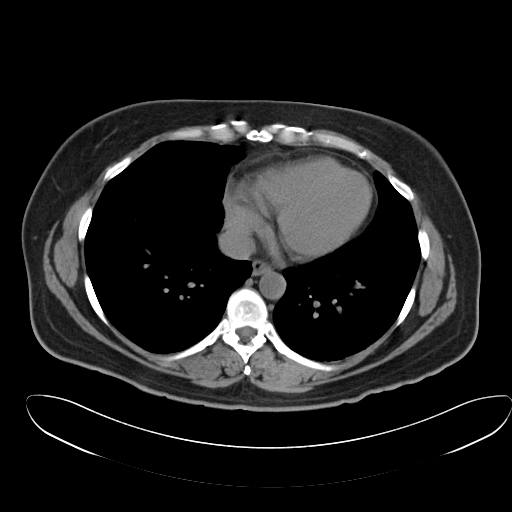
[im 92/97  lung]
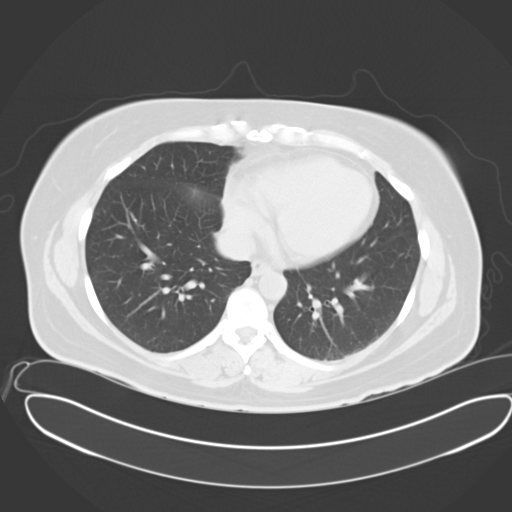

[15 of 32 positions shown; findings below may reference images not displayed]

FINDINGS: There is a 3 mm stone in the distal right ureter at the
ureterovesical junction. Multiple bilateral intrarenal stones. Mild
hydronephrosis and hydroureter on the right with stranding around
the right ureter and kidney. No hydronephrosis or hydroureter on the
left. Bladder is decompressed.

Mildly increased density demonstrated throughout the liver. This is
nonspecific but could indicate iron overload. Laboratory correlation
suggested. The unenhanced appearance of the gallbladder, spleen,
pancreas, adrenal glands, abdominal aorta, inferior vena cava, and
retroperitoneal lymph nodes is unremarkable. Stomach, small bowel,
and colon are not abnormally distended. Stool fills the colon. No
free air or free fluid in the abdomen. Abdominal wall musculature
appears intact.

Pelvis: The appendix is not identified. Uterus is surgically absent.
No free or loculated pelvic fluid collections. No pelvic mass or
lymphadenopathy. No destructive bone lesions.
IMPRESSION: 3 mm stone in the distal right ureter with moderate proximal
obstruction. Bilateral nonobstructing intrarenal stones.

## 2018-01-27 ENCOUNTER — Ambulatory Visit (INDEPENDENT_AMBULATORY_CARE_PROVIDER_SITE_OTHER): Payer: BLUE CROSS/BLUE SHIELD | Admitting: Adult Health

## 2018-01-27 ENCOUNTER — Encounter: Payer: Self-pay | Admitting: Adult Health

## 2018-01-27 VITALS — BP 127/76 | HR 69 | Temp 98.6°F | Ht 68.0 in | Wt 193.8 lb

## 2018-01-27 DIAGNOSIS — J0100 Acute maxillary sinusitis, unspecified: Secondary | ICD-10-CM | POA: Diagnosis not present

## 2018-01-27 MED ORDER — HYDROCOD POLST-CPM POLST ER 10-8 MG/5ML PO SUER: 5.0000 mL | Freq: Two times a day (BID) | ORAL | 0 refills | Status: DC | PRN

## 2018-01-27 MED ORDER — DOXYCYCLINE HYCLATE 100 MG PO TABS: 100.0000 mg | ORAL_TABLET | Freq: Two times a day (BID) | ORAL | 0 refills | Status: DC

## 2018-01-27 NOTE — Progress Notes (Signed)
Subjective:    Patient ID: [name] Orr, female    DOB: [birth year], 47 y.o.   MRN: [id number]  HPI :  [name] Orr presents with copious thin/clear nasal drainage, post nasal gtt, non-productive cough, face pressure, sore throat (2/10), increased fatigue, and frontal HA (constant throbbing and rated 4/10) that started >1.5 weeks ago and has been steadily worsening. She has been using OTC Sudafed with only minimal sx relief. She denies tobacco use She denies CP/dyspnea/dizziness/palpitations. She denies N/V/D She denies fever, but reports chills at night. She has not been on ABX in lat 90 days  Patient Care Team    Relationship Specialty Notifications Start End  Mina Marble D, NP PCP - General Family Medicine  01/07/17   Obgyn, Erling Conte    01/07/17     Patient Active Problem List   Diagnosis Date Noted  . Acute maxillary sinusitis 01/27/2018  . Pharyngitis 09/22/2017  . Soft tissue swelling 05/20/2017  . Pain of left clavicle 05/20/2017  . Migraine without status migrainosus, not intractable 05/20/2017  . Body mass index (bmi) 31.0-31.9, adult 04/22/2017  . Claustrophobia 04/22/2017  . Lumbar back pain 01/21/2017  . Chronic left shoulder pain 01/21/2017  . Healthcare maintenance 01/07/2017  . Left flank pain 09/25/2015  . Abnormal CT of liver 09/25/2015  . Renal stones 09/18/2015  . Intractable pain 09/13/2015  . Chest pain, atypical 01/10/2014  . Neck pain 01/10/2014  . Dizziness 01/10/2014  . Fever blister 07/06/2013  . Neck pain, bilateral 02/06/2013  . Depression 10/12/2010  . Migraine aura, persistent 10/12/2010  . Endometriosis 10/10/2010     Past Medical History:  Diagnosis Date  . Anemia    history  . Anxiety    no meds  . Blood in stool    bright red each time has BM for atleast 6 mths  . Chronic back pain greater than 3 months duration   . Depression   . Endometriosis   . Fainting spell   . GERD (gastroesophageal reflux disease)   .  Headache(784.0)   . History of chicken pox   . Migraine   . Renal disorder    kidney stones     Past Surgical History:  Procedure Laterality Date  . ABDOMINAL HYSTERECTOMY    . BACK SURGERY  [phone]   Dr Glenna Fellows  . colonscopy    . DIAGNOSTIC LAPAROSCOPY     x 2  . DILATION AND CURETTAGE OF UTERUS    . KNEE SURGERY  2009   lateral realignment to lt knee  . laproscopy    . svd     x 1     Family History  Problem Relation Age of Onset  . Hyperlipidemia Mother   . Hypertension Mother   . Alcohol abuse Father   . Heart disease Father   . Stroke Father   . Kidney disease Father      Social History   Substance and Sexual Activity  Drug Use No     Social History   Substance and Sexual Activity  Alcohol Use Yes  . Alcohol/week: 0.0 standard drinks   Comment: 3-4 glasses of wine per month     Social History   Tobacco Use  Smoking Status Never Smoker  Smokeless Tobacco Never Used     Outpatient Encounter Medications as of 01/27/2018  Medication Sig  . cyclobenzaprine (FLEXERIL) 10 MG tablet Take 1 tablet (10 mg total) by mouth 3 (three) times daily as needed for muscle  spasms. Reported on 11/12/2015  . LORazepam (ATIVAN) 1 MG tablet Take 1 tablet (1 mg total) by mouth every 8 (eight) hours as needed for anxiety.  . valACYclovir (VALTREX) 1000 MG tablet Take 2 tablets (2,000 mg total) by mouth 2 (two) times daily.  . chlorpheniramine-HYDROcodone (TUSSIONEX) 10-8 MG/5ML SUER Take 5 mLs by mouth every 12 (twelve) hours as needed.  . doxycycline (VIBRA-TABS) 100 MG tablet Take 1 tablet (100 mg total) by mouth 2 (two) times daily.  . Vitamin D, Ergocalciferol, (DRISDOL) 50000 units CAPS capsule Take 1 capsule (50,000 Units total) by mouth every 7 (seven) days. (Patient not taking: Reported on 01/27/2018)  . [DISCONTINUED] azithromycin (ZITHROMAX) 250 MG tablet 2 tabs day one.  1 tab days 2-5  . [DISCONTINUED] phentermine (ADIPEX-P) 37.5 MG tablet Take 1 tablet  (37.5 mg total) by mouth daily before breakfast.  . [DISCONTINUED] predniSONE (DELTASONE) 20 MG tablet Take 1 tablet (20 mg total) by mouth daily with breakfast. 1 tab every 12 hrs for three days.  1 tab daily for three days.   No facility-administered encounter medications on file as of 01/27/2018.     Allergies: Albuterol and Penicillins  Body mass index is 29.47 kg/m.  Blood pressure 127/76, pulse 69, temperature 98.6 F (37 C), temperature source Oral, height 5\' 8"  (1.727 m), weight 193 lb 12.8 oz (87.9 kg), last menstrual period 10/16/2010, SpO2 100 %.     Review of Systems  Constitutional: Positive for activity change, appetite change, chills and fatigue. Negative for diaphoresis, fever and unexpected weight change.  HENT: Positive for congestion, ear pain, postnasal drip, rhinorrhea, sinus pressure, sinus pain, sneezing, sore throat and voice change. Negative for trouble swallowing.   Eyes: Negative for visual disturbance.  Respiratory: Positive for cough. Negative for chest tightness, shortness of breath and wheezing.   Cardiovascular: Negative for chest pain, palpitations and leg swelling.  Gastrointestinal: Negative for abdominal distention, abdominal pain, blood in stool, constipation, diarrhea, nausea and vomiting.  Genitourinary: Negative for difficulty urinating and flank pain.  Neurological: Positive for headaches. Negative for dizziness.  Hematological: Does not bruise/bleed easily.  Psychiatric/Behavioral: Positive for sleep disturbance.       Objective:   Physical Exam  Constitutional: She is oriented to person, place, and time. She appears well-developed and well-nourished. No distress.  HENT:  Head: Normocephalic and atraumatic.  Right Ear: External ear normal. Tympanic membrane is erythematous and bulging. No decreased hearing is noted.  Left Ear: External ear normal. Tympanic membrane is bulging. Tympanic membrane is not erythematous. No decreased hearing is  noted.  Nose: Mucosal edema and rhinorrhea present. Right sinus exhibits maxillary sinus tenderness and frontal sinus tenderness. Left sinus exhibits maxillary sinus tenderness and frontal sinus tenderness.  Mouth/Throat: Uvula is midline and mucous membranes are normal. Posterior oropharyngeal edema and posterior oropharyngeal erythema present. No oropharyngeal exudate or tonsillar abscesses.  Eyes: Pupils are equal, round, and reactive to light. Conjunctivae and EOM are normal.  Neck: Normal range of motion. Neck supple.  Cardiovascular: Normal rate, regular rhythm, normal heart sounds and intact distal pulses.  No murmur heard. Pulmonary/Chest: Effort normal and breath sounds normal. No stridor. No respiratory distress. She has no wheezes. She has no rales. She exhibits no tenderness.  Lymphadenopathy:    She has no cervical adenopathy.  Neurological: She is alert and oriented to person, place, and time.  Skin: Skin is warm and dry. Capillary refill takes less than 2 seconds. No rash noted. She is not diaphoretic. No  erythema. No pallor.  Psychiatric: She has a normal mood and affect. Her behavior is normal. Judgment and thought content normal.  Nursing note and vitals reviewed.     Assessment & Plan:   1. Acute maxillary sinusitis, recurrence not specified     Acute maxillary sinusitis Please take Doxycycline 100mg  twice daily for 10 days. Avoid sun exposure while using this antibiotic. [place] Controlled Substance Database reviewed- no aberrancies noted. Use Tussionex as needed, do not drive or drink alcohol when taking this cough medicine. Increase fluids/rest/Vit c-2,000mg  day when not feeling well. Use OTC Acetaminophen for fever/body aches and OTC Delsym for cough. If symptoms persist after antibiotic completed, please call clinic.    FOLLOW-UP:  Return if symptoms worsen or fail to improve.

## 2018-01-27 NOTE — Patient Instructions (Signed)

## 2018-01-27 NOTE — Assessment & Plan Note (Signed)
Please take Doxycycline 100mg  twice daily for 10 days. Avoid sun exposure while using this antibiotic. [place] Controlled Substance Database reviewed- no aberrancies noted. Use Tussionex as needed, do not drive or drink alcohol when taking this cough medicine. Increase fluids/rest/Vit c-2,000mg  day when not feeling well. Use OTC Acetaminophen for fever/body aches and OTC Delsym for cough. If symptoms persist after antibiotic completed, please call clinic.

## 2018-03-29 ENCOUNTER — Other Ambulatory Visit: Payer: Self-pay | Admitting: Adult Health

## 2018-04-11 ENCOUNTER — Telehealth: Payer: Self-pay | Admitting: Adult Health

## 2018-04-11 ENCOUNTER — Other Ambulatory Visit: Payer: Self-pay | Admitting: Adult Health

## 2018-04-11 ENCOUNTER — Ambulatory Visit (INDEPENDENT_AMBULATORY_CARE_PROVIDER_SITE_OTHER): Payer: BLUE CROSS/BLUE SHIELD | Admitting: Adult Health

## 2018-04-11 ENCOUNTER — Encounter: Payer: Self-pay | Admitting: Adult Health

## 2018-04-11 VITALS — BP 111/79 | HR 72 | Temp 98.3°F | Ht 68.0 in | Wt 201.1 lb

## 2018-04-11 DIAGNOSIS — M79672 Pain in left foot: Secondary | ICD-10-CM | POA: Diagnosis not present

## 2018-04-11 MED ORDER — HYDROCODONE-IBUPROFEN 5-200 MG PO TABS: 1.0000 | ORAL_TABLET | Freq: Three times a day (TID) | ORAL | 0 refills | Status: DC | PRN

## 2018-04-11 MED ORDER — VITAMIN D (ERGOCALCIFEROL) 1.25 MG (50000 UNIT) PO CAPS: 50000.0000 [IU] | ORAL_CAPSULE | ORAL | 0 refills | Status: DC

## 2018-04-11 MED ORDER — OXYCODONE-ACETAMINOPHEN 5-325 MG PO TABS: 1.0000 | ORAL_TABLET | Freq: Four times a day (QID) | ORAL | 0 refills | Status: AC | PRN

## 2018-04-11 NOTE — Telephone Encounter (Signed)
Pt informed.  T. Nelson, CMA 

## 2018-04-11 NOTE — Telephone Encounter (Signed)
Please advise.  T. Sebastiano Luecke, CMA 

## 2018-04-11 NOTE — Patient Instructions (Signed)
Foot Pain Many things can cause foot pain. Some common causes are:  An injury.  A sprain.  Arthritis.  Blisters.  Bunions.  Follow these instructions at home: Pay attention to any changes in your symptoms. Take these actions to help with your discomfort:  If directed, put ice on the affected area: ? Put ice in a plastic bag. ? Place a towel between your skin and the bag. ? Leave the ice on for 15-20 minutes, 3?4 times a day for 2 days.  Take over-the-counter and prescription medicines only as told by your health care provider.  Wear comfortable, supportive shoes that fit you well. Do not wear high heels.  Do not stand or walk for long periods of time.  Do not lift a lot of weight. This can put added pressure on your feet.  Do stretches to relieve foot pain and stiffness as told by your health care provider.  Rub your foot gently.  Keep your feet clean and dry.  Contact a health care provider if:  Your pain does not get better after a few days of self-care.  Your pain gets worse.  You cannot stand on your foot. Get help right away if:  Your foot is numb or tingling.  Your foot or toes are swollen.  Your foot or toes turn white or blue.  You have warmth and redness along your foot. This information is not intended to replace advice given to you by your health care provider. Make sure you discuss any questions you have with your health care provider. Document Released: 06/07/2015 Document Revised: 10/17/2015 Document Reviewed: 06/06/2014 Elsevier Interactive Patient Education  2018 [employer].  Please follow care instructions as outlined above. Use OTC Acetaminophen and Ibuprofen during day and Vicoprofen during nighttime/severe pain. Referral to Podiatry placed. FEEL BETTER!

## 2018-04-11 NOTE — Progress Notes (Signed)
Subjective:    Patient ID: [name] Orr, female    DOB: April 25, 1971, 47 y.o.   MRN: [id number]  HPI:  [name] presents with left foot pain that developed 3 weeks ago after several long flights and a long layover in [place] airport. Pain is localized over top of left foot, described as burning and will radiate to middle L gastrocnemius, pain rated 6/10 Pain is worse with ambulation on flat foot Pain is decreased with ambulation with shoe that has excellent arch support or OTC medication (ie Acetaminophen, Ibuprofen). She denies pain/warmth/swelling in L gastrocnemius She denies hx of DVT or current tobacco use She denies trauma/injury prior to onset of sx's She denies similar sx's ever occurring before She has hx of bilateral bunions  Patient Care Team    Relationship Specialty Notifications Start End  Mina Marble D, NP PCP - General Family Medicine  01/07/17   Obgyn, Erling Conte    01/07/17     Patient Active Problem List   Diagnosis Date Noted  . Acute foot pain, left 04/11/2018  . Acute maxillary sinusitis 01/27/2018  . Pharyngitis 09/22/2017  . Soft tissue swelling 05/20/2017  . Pain of left clavicle 05/20/2017  . Migraine without status migrainosus, not intractable 05/20/2017  . Body mass index (bmi) 31.0-31.9, adult 04/22/2017  . Claustrophobia 04/22/2017  . Lumbar back pain 01/21/2017  . Chronic left shoulder pain 01/21/2017  . Healthcare maintenance 01/07/2017  . Left flank pain 09/25/2015  . Abnormal CT of liver 09/25/2015  . Renal stones 09/18/2015  . Intractable pain 09/13/2015  . Chest pain, atypical 01/10/2014  . Neck pain 01/10/2014  . Dizziness 01/10/2014  . Fever blister 07/06/2013  . Neck pain, bilateral 02/06/2013  . Depression 10/12/2010  . Migraine aura, persistent 10/12/2010  . Endometriosis 10/10/2010     Past Medical History:  Diagnosis Date  . Anemia    history  . Anxiety    no meds  . Blood in stool    bright red each time  has BM for atleast 6 mths  . Chronic back pain greater than 3 months duration   . Depression   . Endometriosis   . Fainting spell   . GERD (gastroesophageal reflux disease)   . Headache(784.0)   . History of chicken pox   . Migraine   . Renal disorder    kidney stones     Past Surgical History:  Procedure Laterality Date  . ABDOMINAL HYSTERECTOMY    . BACK SURGERY  [phone]   Dr Glenna Fellows  . colonscopy    . DIAGNOSTIC LAPAROSCOPY     x 2  . DILATION AND CURETTAGE OF UTERUS    . KNEE SURGERY  2009   lateral realignment to lt knee  . laproscopy    . svd     x 1     Family History  Problem Relation Age of Onset  . Hyperlipidemia Mother   . Hypertension Mother   . Alcohol abuse Father   . Heart disease Father   . Stroke Father   . Kidney disease Father      Social History   Substance and Sexual Activity  Drug Use No     Social History   Substance and Sexual Activity  Alcohol Use Yes  . Alcohol/week: 0.0 standard drinks   Comment: 3-4 glasses of wine per month     Social History   Tobacco Use  Smoking Status Never Smoker  Smokeless Tobacco Never Used  Outpatient Encounter Medications as of 04/11/2018  Medication Sig  . cyclobenzaprine (FLEXERIL) 10 MG tablet Take 1 tablet (10 mg total) by mouth 3 (three) times daily as needed for muscle spasms. Reported on 11/12/2015  . valACYclovir (VALTREX) 1000 MG tablet TAKE TWO TABLETS BY MOUTH TWICE DAILY  . hydrocodone-ibuprofen (VICOPROFEN) 5-200 MG tablet Take 1 tablet by mouth every 8 (eight) hours as needed for up to 3 days for pain.  . Vitamin D, Ergocalciferol, (DRISDOL) 1.25 MG (50000 UT) CAPS capsule Take 1 capsule (50,000 Units total) by mouth every 7 (seven) days.  . [DISCONTINUED] chlorpheniramine-HYDROcodone (TUSSIONEX) 10-8 MG/5ML SUER Take 5 mLs by mouth every 12 (twelve) hours as needed.  . [DISCONTINUED] doxycycline (VIBRA-TABS) 100 MG tablet Take 1 tablet (100 mg total) by mouth 2  (two) times daily.  . [DISCONTINUED] LORazepam (ATIVAN) 1 MG tablet Take 1 tablet (1 mg total) by mouth every 8 (eight) hours as needed for anxiety.  . [DISCONTINUED] Vitamin D, Ergocalciferol, (DRISDOL) 50000 units CAPS capsule Take 1 capsule (50,000 Units total) by mouth every 7 (seven) days. (Patient not taking: Reported on 01/27/2018)   No facility-administered encounter medications on file as of 04/11/2018.     Allergies: Albuterol and Penicillins  Body mass index is 30.58 kg/m.  Blood pressure 111/79, pulse 72, temperature 98.3 F (36.8 C), temperature source Oral, height 5\' 8"  (1.727 m), weight 201 lb 1.6 oz (91.2 kg), last menstrual period 10/16/2010, SpO2 98 %.     Review of Systems  Constitutional: Positive for activity change.  Cardiovascular: Positive for leg swelling.  Musculoskeletal: Positive for arthralgias, gait problem and myalgias.  Hematological: Does not bruise/bleed easily.  Psychiatric/Behavioral: Positive for sleep disturbance.       Objective:   Physical Exam  Constitutional: She is oriented to person, place, and time. She appears well-developed and well-nourished. No distress.  HENT:  Head: Normocephalic and atraumatic.  Right Ear: External ear normal.  Left Ear: External ear normal.  Nose: Nose normal.  Mouth/Throat: Oropharynx is clear and moist.  Eyes: Pupils are equal, round, and reactive to light. Conjunctivae and EOM are normal.  Cardiovascular: Normal rate, regular rhythm, normal heart sounds and intact distal pulses.  Pulmonary/Chest: Effort normal and breath sounds normal. No stridor. No respiratory distress. She has no wheezes. She has no rales.  Musculoskeletal: Normal range of motion. She exhibits edema and tenderness.       Left lower leg: Normal.       Right foot: Normal.       Left foot: There is tenderness and swelling. There is normal capillary refill.       Feet:  L foot: TTP over 2/3 metatarsals  Swelling noted over dorsum of L  foot  Neurological: She is alert and oriented to person, place, and time.  Skin: Skin is warm and dry. Capillary refill takes less than 2 seconds. No rash noted. She is not diaphoretic. No erythema. No pallor.  Psychiatric: She has a normal mood and affect. Her behavior is normal. Judgment and thought content normal.       Assessment & Plan:   1. Acute foot pain, left     Acute foot pain, left Foot pain care instructions provided. Use OTC Acetaminophen and Ibuprofen during day and Vicoprofen during nighttime/severe pain. [place] Controlled Substance Database reviewed- no aberrancies noted. Referral to Podiatry placed.    FOLLOW-UP:  Return if symptoms worsen or fail to improve.

## 2018-04-11 NOTE — Addendum Note (Signed)
Addended by: Mina Marble D on: 04/11/2018 01:47 PM   Modules accepted: Orders

## 2018-04-11 NOTE — Assessment & Plan Note (Addendum)
Foot pain care instructions provided. Use OTC Acetaminophen and Ibuprofen during day and Vicoprofen during nighttime/severe pain. [place] Controlled Substance Database reviewed- no aberrancies noted. Referral to Podiatry placed.

## 2018-04-11 NOTE — Telephone Encounter (Signed)
Good Afternoon Tonya, Can you please call Ms. Perry and share- Vicoprofen d/c'd and Percocet sent in. Thanks! Valetta Fuller

## 2018-04-11 NOTE — Telephone Encounter (Signed)
Hector with [facility] states that med that was sent in (he did not specify the name of the med in the VM) is not in stock nor is there a [facility] in a 20 mile radius that has this med so it will have to be a special order which could take several business day. They are requesting a call back from clinic staff to maybe look at something comparable that would be in stock. They can be reached at [phone].

## 2018-04-12 ENCOUNTER — Encounter: Payer: Self-pay | Admitting: Podiatry

## 2018-04-12 ENCOUNTER — Ambulatory Visit (INDEPENDENT_AMBULATORY_CARE_PROVIDER_SITE_OTHER): Payer: BLUE CROSS/BLUE SHIELD | Admitting: Podiatry

## 2018-04-12 ENCOUNTER — Ambulatory Visit (INDEPENDENT_AMBULATORY_CARE_PROVIDER_SITE_OTHER): Payer: BLUE CROSS/BLUE SHIELD

## 2018-04-12 DIAGNOSIS — M2011 Hallux valgus (acquired), right foot: Secondary | ICD-10-CM | POA: Diagnosis not present

## 2018-04-12 DIAGNOSIS — M6740 Ganglion, unspecified site: Secondary | ICD-10-CM

## 2018-04-12 DIAGNOSIS — M84375A Stress fracture, left foot, initial encounter for fracture: Secondary | ICD-10-CM | POA: Diagnosis not present

## 2018-04-12 NOTE — Progress Notes (Signed)
Subjective:    Patient ID: [name] Orr, female    DOB: 1970/07/01, 47 y.o.   MRN: [id number]  HPI 47 year old female presents the office today for concerns of pain to the top of her left foot.  She said that she may have felt a mass or cyst on the top of her foot.  She has noticed some swelling to the left foot as well.  She states this started after she flew to [place] and then she drove back to [place].  He denies any recent injury or trauma.  She states is tender if you press on it and she has difficulty putting weight on her foot at times.  She said no recent treatment otherwise.   Review of Systems  All other systems reviewed and are negative.  Past Medical History:  Diagnosis Date  . Anemia    history  . Anxiety    no meds  . Blood in stool    bright red each time has BM for atleast 6 mths  . Chronic back pain greater than 3 months duration   . Depression   . Endometriosis   . Fainting spell   . GERD (gastroesophageal reflux disease)   . Headache(784.0)   . History of chicken pox   . Migraine   . Renal disorder    kidney stones    Past Surgical History:  Procedure Laterality Date  . ABDOMINAL HYSTERECTOMY    . BACK SURGERY  [phone]   Dr Glenna Fellows  . colonscopy    . DIAGNOSTIC LAPAROSCOPY     x 2  . DILATION AND CURETTAGE OF UTERUS    . KNEE SURGERY  2009   lateral realignment to lt knee  . laproscopy    . svd     x 1     Current Outpatient Medications:  .  cyclobenzaprine (FLEXERIL) 10 MG tablet, Take 1 tablet (10 mg total) by mouth 3 (three) times daily as needed for muscle spasms. Reported on 11/12/2015, Disp: 30 tablet, Rfl: 1 .  valACYclovir (VALTREX) 1000 MG tablet, TAKE TWO TABLETS BY MOUTH TWICE DAILY, Disp: 12 tablet, Rfl: 0 .  Vitamin D, Ergocalciferol, (DRISDOL) 1.25 MG (50000 UT) CAPS capsule, Take 1 capsule (50,000 Units total) by mouth every 7 (seven) days., Disp: 16 capsule, Rfl: 0  Allergies  Allergen Reactions  .  Albuterol Other (See Comments)    Headaches  . Penicillins Hives and Other (See Comments)    Has patient had a PCN reaction causing immediate rash, facial/tongue/throat swelling, SOB or lightheadedness with hypotension: Yes Has patient had a PCN reaction causing severe rash involving mucus membranes or skin necrosis: No Has patient had a PCN reaction that required hospitalization No Has patient had a PCN reaction occurring within the last 10 years: No If all of the above answers are "NO", then may proceed with Cephalosporin use.          Objective:   Physical Exam  General: AAO x3, NAD  Dermatological: Skin is warm, dry and supple bilateral. Nails x 10 are well manicured; remaining integument appears unremarkable at this time. There are no open sores, no preulcerative lesions, no rash or signs of infection present.  Vascular: Dorsalis Pedis artery and Posterior Tibial artery pedal pulses are 2/4 bilateral with immedate capillary fill time. Pedal hair growth present. No varicosities and no lower extremity edema present bilateral. There is no pain with calf compression, swelling, warmth, erythema.   Neruologic: Grossly intact via  light touch bilateral. Vibratory intact via tuning fork bilateral. Protective threshold with Semmes Wienstein monofilament intact to all pedal sites bilateral.   Musculoskeletal: Bunion deformities present bilaterally.  There is edema to the left dorsal foot on the forefoot.  There is pinpoint tenderness at the metatarsal area.  There is no other areas of tenderness identified.  There is no soft tissue mass identified today.  Muscular strength 5/5 in all groups tested bilateral.  Gait: Unassisted, Nonantalgic.     Assessment & Plan:  47 year old female left foot stress fracture, metatarsal -Treatment options discussed including all alternatives, risks, and complications -Etiology of symptoms were discussed -X-rays were obtained and reviewed with the patient.   Periosteal reaction is present on the.  No other evidence of acute fracture or stress fracture. -Immobilization in a cam boot.  This was dispensed today.  Ice elevation.  Limit activity.  Trula Slade DPM

## 2018-04-13 ENCOUNTER — Other Ambulatory Visit: Payer: Self-pay | Admitting: Podiatry

## 2018-04-13 DIAGNOSIS — M84375A Stress fracture, left foot, initial encounter for fracture: Secondary | ICD-10-CM

## 2018-04-13 DIAGNOSIS — M2011 Hallux valgus (acquired), right foot: Secondary | ICD-10-CM

## 2018-04-15 DIAGNOSIS — M84375A Stress fracture, left foot, initial encounter for fracture: Secondary | ICD-10-CM | POA: Insufficient documentation

## 2018-04-26 ENCOUNTER — Ambulatory Visit (INDEPENDENT_AMBULATORY_CARE_PROVIDER_SITE_OTHER): Payer: BLUE CROSS/BLUE SHIELD

## 2018-04-26 ENCOUNTER — Ambulatory Visit (INDEPENDENT_AMBULATORY_CARE_PROVIDER_SITE_OTHER): Payer: BLUE CROSS/BLUE SHIELD | Admitting: Podiatry

## 2018-04-26 ENCOUNTER — Encounter: Payer: Self-pay | Admitting: Podiatry

## 2018-04-26 DIAGNOSIS — M84375A Stress fracture, left foot, initial encounter for fracture: Secondary | ICD-10-CM

## 2018-04-26 DIAGNOSIS — M84375D Stress fracture, left foot, subsequent encounter for fracture with routine healing: Secondary | ICD-10-CM

## 2018-04-29 NOTE — Progress Notes (Signed)
Subjective: 47 year old female presents the office today for follow-up evaluation of a stress fracture of the left foot.  She is remained in the cam boot.  She states that the swelling the pain has improved.  She denies any recent injury or trauma or any changes in the last saw her.Denies any systemic complaints such as fevers, chills, nausea, vomiting. No acute changes since last appointment, and no other complaints at this time.   Objective: AAO x3, NAD DP/PT pulses palpable bilaterally, CRT less than 3 seconds There is still tenderness to palpation although decreased along the dorsal aspect the left foot mostly along the second metatarsal area.  There is no pain to vibratory sensation.  There is decrease edema but still some trace edema remains.  There is no erythema or warmth.  No open lesions.  Tender, flexor tendons appear to be intact.  No open lesions or pre-ulcerative lesions.  No pain with calf compression, swelling, warmth, erythema  Assessment: Stress fracture left foot with improvement  Plan: -All treatment options discussed with the patient including all alternatives, risks, complications.  -Repeat x-rays were obtained reviewed.  There is periosteal reaction of the neck of the second metatarsal concerning for stress fracture there is no other evidence of acute fracture identified.  Bunion deformities present. -At this point I recommend staying in the cam boot for the next couple weeks.  If he starts to feel better about 2 weeks he can start to transition to a regular shoe otherwise I want her immobilized for a total of 6 weeks.  Continue ice elevate. Continue compression. -Patient encouraged to call the office with any questions, concerns, change in symptoms.   Trula Slade DPM

## 2018-05-26 ENCOUNTER — Ambulatory Visit: Payer: BLUE CROSS/BLUE SHIELD | Admitting: Podiatry

## 2018-06-15 ENCOUNTER — Telehealth: Payer: Self-pay | Admitting: Adult Health

## 2018-06-15 ENCOUNTER — Other Ambulatory Visit: Payer: Self-pay | Admitting: Adult Health

## 2018-06-15 MED ORDER — FLUCONAZOLE 150 MG PO TABS: 150.0000 mg | ORAL_TABLET | Freq: Once | ORAL | 0 refills | Status: AC

## 2018-06-15 NOTE — Telephone Encounter (Signed)
Pt informed of RX.  T. Kartik Fernando, CMA 

## 2018-06-15 NOTE — Telephone Encounter (Signed)
Patient called and states she has a yeast infection and is hoping [name] Orr would be willing to call something in for her versus having to come in. She states insurance changed and with her deductible would basically have to pay out of pocket for an OV. If we can bypass the OV and send in a [facility] [place]. Patient would like a call back either way to know exactly what we are doing please.

## 2018-06-15 NOTE — Telephone Encounter (Signed)
Diflucan sent in. Thanks! Valetta Fuller

## 2018-06-15 NOTE — Telephone Encounter (Signed)
Please advise if you are agreeable to this.  T. Nelson, CMA 

## 2018-06-30 ENCOUNTER — Encounter: Payer: Self-pay | Admitting: Adult Health

## 2018-06-30 ENCOUNTER — Ambulatory Visit (INDEPENDENT_AMBULATORY_CARE_PROVIDER_SITE_OTHER): Payer: 59 | Admitting: Adult Health

## 2018-06-30 VITALS — BP 110/76 | HR 86 | Temp 98.8°F | Ht 68.0 in | Wt 206.6 lb

## 2018-06-30 DIAGNOSIS — H9313 Tinnitus, bilateral: Secondary | ICD-10-CM | POA: Diagnosis not present

## 2018-06-30 DIAGNOSIS — H6690 Otitis media, unspecified, unspecified ear: Secondary | ICD-10-CM | POA: Diagnosis not present

## 2018-06-30 MED ORDER — DOXYCYCLINE HYCLATE 100 MG PO TABS
100.0000 mg | ORAL_TABLET | Freq: Two times a day (BID) | ORAL | 0 refills | Status: DC
Start: 2018-06-30 — End: 2018-08-30

## 2018-06-30 MED ORDER — CYCLOBENZAPRINE HCL 10 MG PO TABS: 10.0000 mg | ORAL_TABLET | Freq: Three times a day (TID) | ORAL | 1 refills | Status: DC | PRN

## 2018-06-30 NOTE — Progress Notes (Signed)
Subjective:    Patient ID: [name] Orr, female    DOB: 1970/11/17, 48 y.o.   MRN: [id number]  HPI:  [name] presents with "roaring in my bed" that started 5 days ago upon waking up Saturday am. She denies previous exposure to loud noise or hx of tinnitus She denies dizziness or hx of vertigo She reports the "roaring" if louder in L ear She reports yesterday she had L parietal HA (8/10, pressure) and nausea without vomiting- estimated time of sx's about 4 hrs She has hx of migraine and she states, "this did not feel like a migraine" She reports nasal congestion, but has not been blowing her nose She reports L ear pressure She denies cough/fever/night sweats/chills She denies change in vision She denies CP/dypnea/dizziness/palpitations She denies tobacco/vape use She denies this ever occurring previously   Patient Care Team    Relationship Specialty Notifications Start End  [name] Grandchild, NP PCP - General Family Medicine  01/07/17   Obgyn, [name] Orr    01/07/17     Patient Active Problem List   Diagnosis Date Noted  . Tinnitus of both ears 06/30/2018  . Acute otitis media 06/30/2018  . Stress fracture, left foot, initial encounter for fracture 04/15/2018  . Acute foot pain, left 04/11/2018  . Acute maxillary sinusitis 01/27/2018  . Pharyngitis 09/22/2017  . Soft tissue swelling 05/20/2017  . Pain of left clavicle 05/20/2017  . Migraine without status migrainosus, not intractable 05/20/2017  . Body mass index (bmi) 31.0-31.9, adult 04/22/2017  . Claustrophobia 04/22/2017  . Lumbar back pain 01/21/2017  . Chronic left shoulder pain 01/21/2017  . Healthcare maintenance 01/07/2017  . Left flank pain 09/25/2015  . Abnormal CT of liver 09/25/2015  . Renal stones 09/18/2015  . Intractable pain 09/13/2015  . Chest pain, atypical 01/10/2014  . Neck pain 01/10/2014  . Dizziness 01/10/2014  . Fever blister 07/06/2013  . Neck pain, bilateral 02/06/2013  .  Depression 10/12/2010  . Migraine aura, persistent 10/12/2010  . Endometriosis 10/10/2010     Past Medical History:  Diagnosis Date  . Anemia    history  . Anxiety    no meds  . Blood in stool    bright red each time has BM for atleast 6 mths  . Chronic back pain greater than 3 months duration   . Depression   . Endometriosis   . Fainting spell   . GERD (gastroesophageal reflux disease)   . Headache(784.0)   . History of chicken pox   . Migraine   . Renal disorder    kidney stones     Past Surgical History:  Procedure Laterality Date  . ABDOMINAL HYSTERECTOMY    . BACK SURGERY  [phone]   Dr Glenna Fellows  . colonscopy    . DIAGNOSTIC LAPAROSCOPY     x 2  . DILATION AND CURETTAGE OF UTERUS    . KNEE SURGERY  2009   lateral realignment to lt knee  . laproscopy    . svd     x 1     Family History  Problem Relation Age of Onset  . Hyperlipidemia Mother   . Hypertension Mother   . Alcohol abuse Father   . Heart disease Father   . Stroke Father   . Kidney disease Father      Social History   Substance and Sexual Activity  Drug Use No     Social History   Substance and Sexual Activity  Alcohol  Use Yes  . Alcohol/week: 0.0 standard drinks   Comment: 3-4 glasses of wine per month     Social History   Tobacco Use  Smoking Status Never Smoker  Smokeless Tobacco Never Used     Outpatient Encounter Medications as of 06/30/2018  Medication Sig  . cyclobenzaprine (FLEXERIL) 10 MG tablet Take 1 tablet (10 mg total) by mouth 3 (three) times daily as needed for muscle spasms. Reported on 11/12/2015  . valACYclovir (VALTREX) 1000 MG tablet TAKE TWO TABLETS BY MOUTH TWICE DAILY  . Vitamin D, Ergocalciferol, (DRISDOL) 1.25 MG (50000 UT) CAPS capsule Take 1 capsule (50,000 Units total) by mouth every 7 (seven) days.  . [DISCONTINUED] cyclobenzaprine (FLEXERIL) 10 MG tablet Take 1 tablet (10 mg total) by mouth 3 (three) times daily as needed for muscle  spasms. Reported on 11/12/2015  . doxycycline (VIBRA-TABS) 100 MG tablet Take 1 tablet (100 mg total) by mouth 2 (two) times daily.   No facility-administered encounter medications on file as of 06/30/2018.     Allergies: Albuterol and Penicillins  Body mass index is 31.41 kg/m.  Blood pressure 110/76, pulse 86, temperature 98.8 F (37.1 C), temperature source Oral, height 5\' 8"  (1.727 m), weight 206 lb 9.6 oz (93.7 kg), last menstrual period 10/16/2010, SpO2 98 %.  Review of Systems  Constitutional: Positive for activity change and fatigue. Negative for appetite change, chills, diaphoresis, fever and unexpected weight change.  HENT: Positive for congestion and facial swelling. Negative for nosebleeds, postnasal drip, rhinorrhea, sinus pain, sore throat, trouble swallowing and voice change.        "roaring in ears"  Eyes: Negative for visual disturbance.  Respiratory: Negative for cough, chest tightness, shortness of breath, wheezing and stridor.   Cardiovascular: Negative for chest pain, palpitations and leg swelling.  Gastrointestinal: Positive for nausea. Negative for abdominal pain, blood in stool, constipation, diarrhea and vomiting.  Neurological: Positive for headaches. Negative for dizziness.  Hematological: Does not bruise/bleed easily.  Psychiatric/Behavioral: Positive for sleep disturbance.       Objective:   Physical Exam Vitals signs and nursing note reviewed.  Constitutional:      Appearance: She is ill-appearing. She is not toxic-appearing or diaphoretic.  HENT:     Head: Normocephalic and atraumatic.     Right Ear: No decreased hearing noted. Tympanic membrane is bulging. Tympanic membrane is not erythematous.     Left Ear: No decreased hearing noted. Tympanic membrane is erythematous and bulging.     Ears:     Comments: Bubbles behind L TM     Nose:     Right Turbinates: Swollen.     Left Turbinates: Swollen.     Right Sinus: Maxillary sinus tenderness  present. No frontal sinus tenderness.     Left Sinus: Maxillary sinus tenderness present. No frontal sinus tenderness.     Mouth/Throat:     Mouth: Mucous membranes are moist.     Pharynx: Posterior oropharyngeal erythema present. No oropharyngeal exudate.     Tonsils: No tonsillar exudate or tonsillar abscesses. Swelling: 0 on the right. 0 on the left.  Eyes:     General: No visual field deficit.    Extraocular Movements: Extraocular movements intact.     Right eye: Normal extraocular motion and no nystagmus.     Left eye: Normal extraocular motion and no nystagmus.     Pupils: Pupils are equal, round, and reactive to light.     Right eye: Pupil is round and reactive.  Left eye: Pupil is round and reactive.  Neck:     Musculoskeletal: Normal range of motion and neck supple.  Cardiovascular:     Rate and Rhythm: Normal rate.     Heart sounds: Normal heart sounds. No murmur. No friction rub. No gallop.   Pulmonary:     Effort: Pulmonary effort is normal. No respiratory distress.     Breath sounds: Normal breath sounds. No stridor. No wheezing, rhonchi or rales.  Chest:     Chest wall: No tenderness.  Lymphadenopathy:     Cervical: No cervical adenopathy.  Neurological:     Mental Status: She is alert and oriented to person, place, and time.     Cranial Nerves: No cranial nerve deficit or facial asymmetry.     Sensory: No sensory deficit.     Motor: No weakness.     Coordination: Romberg sign negative. Coordination normal.     Gait: Gait normal.  Psychiatric:        Mood and Affect: Mood normal. Mood is not anxious or depressed.        Speech: Speech normal.        Behavior: Behavior normal. Behavior is not agitated.        Cognition and Memory: Cognition is not impaired. Memory is not impaired.       Assessment & Plan:   1. Tinnitus of both ears   2. Acute otitis media, unspecified otitis media type     Acute otitis media Please take Doxycycline as  directed. Increase fluids. If constant "roaring" continues after full course of Doxycycline, then call clinic and we can discuss further work-up.  Tinnitus of both ears Please take Doxycycline as directed. Increase fluids. If constant "roaring" continues after full course of Doxycycline, then call clinic and we can discuss further work-up.  Discussed Red Flag Sx's of stroke and to seek immediate medical care if any noted, she verbalized understanding and agreement.  FOLLOW-UP:  Return if symptoms worsen or fail to improve.

## 2018-06-30 NOTE — Assessment & Plan Note (Signed)
Please take Doxycycline as directed. Increase fluids. If constant "roaring" continues after full course of Doxycycline, then call clinic and we can discuss further work-up.

## 2018-06-30 NOTE — Patient Instructions (Signed)
Otitis Media, Adult  Otitis media occurs when there is inflammation and fluid in the middle ear. Your middle ear is a part of the ear that contains bones for hearing as well as air that helps send sounds to your brain. What are the causes? This condition is caused by a blockage in the eustachian tube. This tube drains fluid from the ear to the back of the nose (nasopharynx). A blockage in this tube can be caused by an object or by swelling (edema) in the tube. Problems that can cause a blockage include:  A cold or other upper respiratory infection.  Allergies.  An irritant, such as tobacco smoke.  Enlarged adenoids. The adenoids are areas of soft tissue located high in the back of the throat, behind the nose and the roof of the mouth.  A mass in the nasopharynx.  Damage to the ear caused by pressure changes (barotrauma). What are the signs or symptoms? Symptoms of this condition include:  Ear pain.  A fever.  Decreased hearing.  A headache.  Tiredness (lethargy).  Fluid leaking from the ear.  Ringing in the ear. How is this diagnosed? This condition is diagnosed with a physical exam. During the exam your health care provider will use an instrument called an otoscope to look into your ear and check for redness, swelling, and fluid. He or she will also ask about your symptoms. Your health care provider may also order tests, such as:  A test to check the movement of the eardrum (pneumatic otoscopy). This test is done by squeezing a small amount of air into the ear.  A test that changes air pressure in the middle ear to check how well the eardrum moves and whether the eustachian tube is working (tympanogram). How is this treated? This condition usually goes away on its own within 3-5 days. But if the condition is caused by a bacteria infection and does not go away own its own, or keeps coming back, your health care provider may:  Prescribe antibiotic medicines to treat the  infection.  Prescribe or recommend medicines to control pain. Follow these instructions at home:  Take over-the-counter and prescription medicines only as told by your health care provider.  If you were prescribed an antibiotic medicine, take it as told by your health care provider. Do not stop taking the antibiotic even if you start to feel better.  Keep all follow-up visits as told by your health care provider. This is important. Contact a health care provider if:  You have bleeding from your nose.  There is a lump on your neck.  You are not getting better in 5 days.  You feel worse instead of better. Get help right away if:  You have severe pain that is not controlled with medicine.  You have swelling, redness, or pain around your ear.  You have stiffness in your neck.  A part of your face is paralyzed.  The bone behind your ear (mastoid) is tender when you touch it.  You develop a severe headache. Summary  Otitis media is redness, soreness, and swelling of the middle ear.  This condition usually goes away on its own within 3-5 days.  If the problem does not go away in 3-5 days, your health care provider may prescribe or recommend medicines to treat your symptoms.  If you were prescribed an antibiotic medicine, take it as told by your health care provider. This information is not intended to replace advice given to you by   your health care provider. Make sure you discuss any questions you have with your health care provider. Document Released: 02/14/2004 Document Revised: 05/01/2016 Document Reviewed: 05/01/2016 Elsevier Interactive Patient Education  2019 [employer].   Tinnitus Tinnitus refers to hearing a sound when there is no actual source for that sound. This is often described as ringing in the ears. However, people with this condition may hear a variety of noises, in one ear or in both ears. The sounds of tinnitus can be soft, loud, or somewhere in between.  Tinnitus can last for a few seconds or can be constant for days. It may go away without treatment and come back at various times. When tinnitus is constant or happens often, it can lead to other problems, such as trouble sleeping and trouble concentrating. Almost everyone experiences tinnitus at some point. Tinnitus that is long-lasting (chronic) or comes back often (recurs) may require medical attention. What are the causes? The cause of tinnitus is often not known. In some cases, it can result from other problems or conditions, including:  Exposure to loud noises from machinery, music, or other sources.  Hearing loss.  Ear or sinus infections.  Earwax buildup.  An object (foreign body) stuck in the ear.  Taking certain medicines.  Drinking alcohol or caffeine.  High blood pressure.  Heart diseases.  Anemia.  Allergies.  Meniere's disease.  Thyroid problems.  Tumors.  A weak, bulging blood vessel (aneurysm) near the ear.  Depression or other mood disorders. What are the signs or symptoms? The main symptom of tinnitus is hearing a sound when there is no source for that sound. It may sound like:  Buzzing.  Roaring.  Ringing.  Blowing air, like the sound heard when you listen to a seashell.  Hissing.  Whistling.  Sizzling.  Humming.  Running water.  A musical note.  Tapping. Symptoms may affect only one ear (unilateral) or both ears (bilateral). How is this diagnosed? Tinnitus is diagnosed based on your symptoms, your medical history, and a physical exam. Your health care provider may do a thorough hearing test (audiologic exam) if your tinnitus:  Is unilateral.  Causes hearing difficulties.  Lasts 6 months or longer. You may work with a health care provider who specializes in hearing disorders (audiologist). You may be asked questions about your symptoms and how they affect your daily life. You may have other tests done, such as:  CT  scan.  MRI.  An imaging test of how blood flows through your blood vessels (angiogram). How is this treated? Treating an underlying medical condition can sometimes make tinnitus go away. If your tinnitus continues, other treatments may include:  Medicines, such as antidepressants or sleeping aids.  Sound generators to mask the tinnitus. These include: ? Tabletop sound machines that play relaxing sounds to help you fall asleep. ? Wearable devices that fit in your ear and play sounds or music. ? Acoustic neural stimulation. This involves using headphones to listen to music that contains an auditory signal. Over time, listening to this signal may change some pathways in your brain and make you less sensitive to tinnitus. This treatment is used for very severe cases when no other treatment is working.  Therapy and counseling to help you manage the stress of living with tinnitus.  Using hearing aids or cochlear implants if your tinnitus is related to hearing loss. Hearing aids are worn in the outer ear. Cochlear implants are surgically placed in the inner ear. Follow these instructions at  home: Managing symptoms      When possible, avoid being in loud places and being exposed to loud sounds.  Wear hearing protection, such as earplugs, when you are exposed to loud noises.  Use a white noise machine, a humidifier, or other devices to mask the sound of tinnitus.  Practice techniques for reducing stress, such as meditation, yoga, or deep breathing. Work with your health care provider if you need help with managing stress.  Sleep with your head slightly raised. This may reduce the impact of tinnitus. General instructions  Do not use stimulants, such as nicotine, alcohol, or caffeine. Talk with your health care provider about other stimulants to avoid. Stimulants are substances that can make you feel alert and attentive by increasing certain activities in the body (such as heart rate and  blood pressure). These substances may make tinnitus worse.  Take over-the-counter and prescription medicines only as told by your health care provider.  Try to get plenty of sleep each night.  Keep all follow-up visits as told by your health care provider. This is important. Contact a health care provider if:  Your tinnitus continues for 3 weeks or longer without stopping.  Your symptoms get worse or do not get better with home care.  You develop tinnitus after a head injury.  You have tinnitus along with any of the following: ? Dizziness. ? Loss of balance. ? Nausea and vomiting. Summary  Tinnitus refers to hearing a sound when there is no actual source for that sound. This is often described as ringing in the ears.  Symptoms may affect only one ear (unilateral) or both ears (bilateral).  Use a white noise machine, a humidifier, or other devices to mask the sound of tinnitus.  Do not use stimulants, such as nicotine, alcohol, or caffeine. Talk with your health care provider about other stimulants to avoid. These substances may make tinnitus worse. This information is not intended to replace advice given to you by your health care provider. Make sure you discuss any questions you have with your health care provider. Document Released: 05/11/2005 Document Revised: 02/18/2017 Document Reviewed: 02/18/2017 Elsevier Interactive Patient Education  2019 [employer].   Please take Doxycycline as directed. Increase fluids. If constant "roaring" continues after full course of Doxycycline, then call clinic and we can discuss further work-up. FEEL BETTER!

## 2018-08-04 DIAGNOSIS — M7542 Impingement syndrome of left shoulder: Secondary | ICD-10-CM | POA: Diagnosis not present

## 2018-08-28 ENCOUNTER — Other Ambulatory Visit: Payer: Self-pay | Admitting: Adult Health

## 2018-08-30 ENCOUNTER — Other Ambulatory Visit: Payer: Self-pay

## 2018-08-30 ENCOUNTER — Encounter: Payer: Self-pay | Admitting: Adult Health

## 2018-08-30 ENCOUNTER — Ambulatory Visit (INDEPENDENT_AMBULATORY_CARE_PROVIDER_SITE_OTHER): Payer: 59 | Admitting: Adult Health

## 2018-08-30 DIAGNOSIS — M25552 Pain in left hip: Secondary | ICD-10-CM | POA: Insufficient documentation

## 2018-08-30 DIAGNOSIS — M7918 Myalgia, other site: Secondary | ICD-10-CM
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
## 2018-08-30 NOTE — Assessment & Plan Note (Signed)
Assessment and Plan: Recommend Epson [place] Recommend daily stretching and exercise as tolerated Continue OTC Acetaminophen  Keep appt with Dr. Betsy Pries Specialist  Follow Up Instructions: PRN   I discussed the assessment and treatment plan with the patient. The patient was provided an opportunity to ask questions and all were answered. The patient agreed with the plan and demonstrated an understanding of the instructions.   The patient was advised to call back or seek an in-person evaluation if the symptoms worsen or if the condition fails to improve as anticipated.

## 2018-09-06 ENCOUNTER — Ambulatory Visit (INDEPENDENT_AMBULATORY_CARE_PROVIDER_SITE_OTHER): Payer: 59 | Admitting: Adult Health

## 2018-09-06 ENCOUNTER — Encounter: Payer: Self-pay | Admitting: Adult Health

## 2018-09-06 ENCOUNTER — Other Ambulatory Visit: Payer: Self-pay

## 2018-09-06 DIAGNOSIS — M7542 Impingement syndrome of left shoulder: Secondary | ICD-10-CM | POA: Diagnosis not present

## 2018-09-06 DIAGNOSIS — M797 Fibromyalgia: Secondary | ICD-10-CM

## 2018-09-06 MED ORDER — DULOXETINE HCL 30 MG PO CPEP: ORAL_CAPSULE | ORAL | 0 refills | Status: DC

## 2018-09-06 NOTE — Assessment & Plan Note (Signed)
Assessment and Plan: F/u with OB/GYN- labs Start Duloxetine 30mg - One tablet by mouth in the morning for one week, then increase to two tablets by mouth in the morning. Continue to drink plenty of water, follow heart healthy diet, increase daily walking as tolerated  Follow Up Instructions: Telephone OV 3 weeks, sooner if needed   I discussed the assessment and treatment plan with the patient. The patient was provided an opportunity to ask questions and all were answered. The patient agreed with the plan and demonstrated an understanding of the instructions.   The patient was advised to call back or seek an in-person evaluation if the symptoms worsen or if the condition fails to improve as anticipated.

## 2018-09-06 NOTE — Progress Notes (Signed)
Virtual Visit via Telephone Note  I connected with [name] Orr on 09/06/18 at  2:30 PM EDT by telephone and verified that I am speaking with the correct person using two identifiers.   I discussed the limitations, risks, security and privacy concerns of performing an evaluation and management service by telephone and the availability of in person appointments. The staff discussed with the patient that there may be a patient responsible charge related to this service. The patient expressed understanding and agreed to proceed.   History of Present Illness: [name] Orr calls in today to discuss diffuse pain issues. This morning she was seen by [facility]/Dr. Grandville Silos- F/U L shoulder bursitis injection Dr. Grandville Silos and his PA believed that her diffuse pain was either r/t to  1) Menopause- advised to f/u with her OB/GYN 2) Autoimmune disorder- recommended referral to Rheumatologist She was offered NSAIDs and told to f/u with her PCP  Advised her to have labs drawn at her established OB/GYN- to determine if she is in menopause Discussed the possibility that she may have Fibromyalgia and that treatment with SNRI - Duloxetine could be helpful. If labs are normal and Duloxetine does not decrease overall pain level, will refer to Rheumatologist She again denies any known family hx of Lupus, MS, or, any other autoimmune disorders  Patient Care Team    Relationship Specialty Notifications Start End  Mina Marble D, NP PCP - General Family Medicine  01/07/17   Obgyn, Erling Conte    01/07/17     Patient Active Problem List   Diagnosis Date Noted  . Acute hip pain, left 08/30/2018  . Pain in deltoid, bilateral 08/30/2018  . Tinnitus of both ears 06/30/2018  . Stress fracture, left foot, initial encounter for fracture 04/15/2018  . Acute foot pain, left 04/11/2018  . Acute maxillary sinusitis 01/27/2018  . Soft tissue swelling 05/20/2017  . Migraine without status migrainosus, not  intractable 05/20/2017  . Body mass index (bmi) 31.0-31.9, adult 04/22/2017  . Claustrophobia 04/22/2017  . Lumbar back pain 01/21/2017  . Chronic left shoulder pain 01/21/2017  . Healthcare maintenance 01/07/2017  . Left flank pain 09/25/2015  . Abnormal CT of liver 09/25/2015  . Renal stones 09/18/2015  . Intractable pain 09/13/2015  . Chest pain, atypical 01/10/2014  . Neck pain 01/10/2014  . Dizziness 01/10/2014  . Fever blister 07/06/2013  . Neck pain, bilateral 02/06/2013  . Depression 10/12/2010  . Migraine aura, persistent 10/12/2010  . Endometriosis 10/10/2010     Past Medical History:  Diagnosis Date  . Anemia    history  . Anxiety    no meds  . Blood in stool    bright red each time has BM for atleast 6 mths  . Chronic back pain greater than 3 months duration   . Depression   . Endometriosis   . Fainting spell   . GERD (gastroesophageal reflux disease)   . Headache(784.0)   . History of chicken pox   . Migraine   . Renal disorder    kidney stones     Past Surgical History:  Procedure Laterality Date  . ABDOMINAL HYSTERECTOMY    . BACK SURGERY  [phone]   Dr Glenna Fellows  . colonscopy    . DIAGNOSTIC LAPAROSCOPY     x 2  . DILATION AND CURETTAGE OF UTERUS    . KNEE SURGERY  2009   lateral realignment to lt knee  . laproscopy    . svd  x 1     Family History  Problem Relation Age of Onset  . Hyperlipidemia Mother   . Hypertension Mother   . Alcohol abuse Father   . Heart disease Father   . Stroke Father   . Kidney disease Father      Social History   Substance and Sexual Activity  Drug Use No     Social History   Substance and Sexual Activity  Alcohol Use Yes  . Alcohol/week: 0.0 standard drinks   Comment: 3-4 glasses of wine per month     Social History   Tobacco Use  Smoking Status Never Smoker  Smokeless Tobacco Never Used     Outpatient Encounter Medications as of 09/06/2018  Medication Sig  .  cyclobenzaprine (FLEXERIL) 10 MG tablet Take 1 tablet (10 mg total) by mouth 3 (three) times daily as needed for muscle spasms. Reported on 11/12/2015  . valACYclovir (VALTREX) 1000 MG tablet TAKE TWO TABLETS BY MOUTH TWICE DAILY  . Vitamin D, Ergocalciferol, (DRISDOL) 1.25 MG (50000 UT) CAPS capsule Take 1 capsule (50,000 Units total) by mouth every 7 (seven) days.  . DULoxetine (CYMBALTA) 30 MG capsule One tablet by mouth in the morning for one week, then increase to two tablets by mouth in the morning.   No facility-administered encounter medications on file as of 09/06/2018.     Allergies: Albuterol and Penicillins  There is no height or weight on file to calculate BMI.  Last menstrual period 10/16/2010.  Observations/Objective: No acute distress noted over the telephone  Assessment and Plan: F/u with OB/GYN- labs Start Duloxetine 30mg - One tablet by mouth in the morning for one week, then increase to two tablets by mouth in the morning. Continue to drink plenty of water, follow heart healthy diet, increase daily walking as tolerated  Follow Up Instructions: Telephone OV 3 weeks, sooner if needed   I discussed the assessment and treatment plan with the patient. The patient was provided an opportunity to ask questions and all were answered. The patient agreed with the plan and demonstrated an understanding of the instructions.   The patient was advised to call back or seek an in-person evaluation if the symptoms worsen or if the condition fails to improve as anticipated.  I provided 25 minutes of non-face-to-face time during this encounter.   Esaw Grandchild, NP

## 2018-09-07 DIAGNOSIS — M791 Myalgia, unspecified site: Secondary | ICD-10-CM | POA: Diagnosis not present

## 2018-09-07 LAB — FOLLICLE STIMULATING HORMONE: FSH: 3.8

## 2018-09-07 LAB — ESTRADIOL: Estradiol: 191.5

## 2018-09-07 LAB — TSH: TSH: 2.71 (ref 0.41–5.90)

## 2018-09-07 LAB — ANTI-DNA ANTIBODY, DOUBLE-STRANDED: ds DNA Ab: 1

## 2018-09-13 ENCOUNTER — Telehealth: Payer: Self-pay | Admitting: Adult Health

## 2018-09-13 ENCOUNTER — Telehealth: Payer: Self-pay

## 2018-09-13 DIAGNOSIS — R52 Pain, unspecified: Secondary | ICD-10-CM

## 2018-09-13 NOTE — Telephone Encounter (Signed)
Olivia Mackie from [facility] called stating that pt is NOT menopausal nor perimenopausal.  Please advise if you would like to refer pt to rheumatology.  Charyl Bigger, CMA

## 2018-09-13 NOTE — Telephone Encounter (Signed)
Yes please place referral to Rheumatologist, re: diffuse joint pain Thanks! Valetta Fuller

## 2018-09-13 NOTE — Telephone Encounter (Signed)
Patient called says she had Labs drawn elsewhere & they should have sent results to [facility] for review--- Per Patient a referral to Rheumatology was pending on Lab results & she was hoping for a call from provider w/ approval.   ---Forwarding request to medical assistant for review w/provider & referral approval.  --glh

## 2018-09-13 NOTE — Telephone Encounter (Signed)
Per Mina Marble, advised pt that she would need to have GYN interpret lab results to determine whether or not she is in or entering menopause before Valetta Fuller will know how to proceed with possible referral.  Advised pt to have GYN send documentation of their interpretation of lab results.  Pt expressed understanding and is agreeable.  Charyl Bigger, CMA

## 2018-09-28 DIAGNOSIS — Z79899 Other long term (current) drug therapy: Secondary | ICD-10-CM | POA: Diagnosis not present

## 2018-09-28 DIAGNOSIS — M2550 Pain in unspecified joint: Secondary | ICD-10-CM | POA: Diagnosis not present

## 2018-09-29 ENCOUNTER — Other Ambulatory Visit: Payer: Self-pay | Admitting: Adult Health

## 2018-09-29 NOTE — Telephone Encounter (Signed)
Pt was to have followed up re: duloxetine.  LVM for pt to call to schedule telemedicine visit prior to further refills.  Charyl Bigger, CMA

## 2018-10-25 ENCOUNTER — Other Ambulatory Visit: Payer: Self-pay | Admitting: Adult Health

## 2019-06-12 ENCOUNTER — Telehealth: Payer: Self-pay | Admitting: Adult Health

## 2019-06-12 NOTE — Telephone Encounter (Signed)
Patient called earlier today wanted PCP to be advised she was going to be tested @ [facility] for COVID.  --FYI to med asst.  --glh

## 2019-06-13 NOTE — Telephone Encounter (Signed)
Noted.  T. Haylo Fake, CMA 

## 2019-06-22 ENCOUNTER — Telehealth: Payer: Self-pay | Admitting: Adult Health

## 2019-06-22 NOTE — Telephone Encounter (Signed)
06/22/2019  Pt informed that MyChart message sent with information regarding COVID guidelines.  Pt expressed understanding and is agreeable.  Charyl Bigger, CMA

## 2019-06-22 NOTE — Telephone Encounter (Signed)
Patient did go get tested for COVID and is positive, she has some clinical questions about this and to make sure she adhering to proper protocols for this incident. Please contact when available

## 2019-06-30 ENCOUNTER — Telehealth: Payer: Self-pay

## 2019-06-30 NOTE — Telephone Encounter (Signed)
Pt calls stating that she was on a conference call and she has had cough that she struggled to talk during the call.  Pt requests RX for Tussionex.  Advised pt that Mina Marble is out of the office on Fridays.  Advised pt that, given her multiple medications, she should consult with her pharmacist and seek advice on which OTC cough meds would be safe to take.    Pt also requests that provider review request for Tussionex upon her return to the office on Monday.  Charyl Bigger, CMA

## 2019-09-04 ENCOUNTER — Ambulatory Visit: Payer: 59 | Admitting: Dermatology

## 2019-09-10 IMAGING — US US SOFT TISSUE HEAD/NECK
1 series · 7 of 7 positions shown · non-contrast
Comparison: None.

CLINICAL DATA: Left clavicular edema

EXAM:
ULTRASOUND OF HEAD/NECK SOFT TISSUES
TECHNIQUE: Ultrasound examination of the head and neck soft tissues was
performed in the area of clinical concern.

[Series 1: us soft tissue head/neck · 0.09mm/px · 7 of 7 slices shown]
[im 1/7]
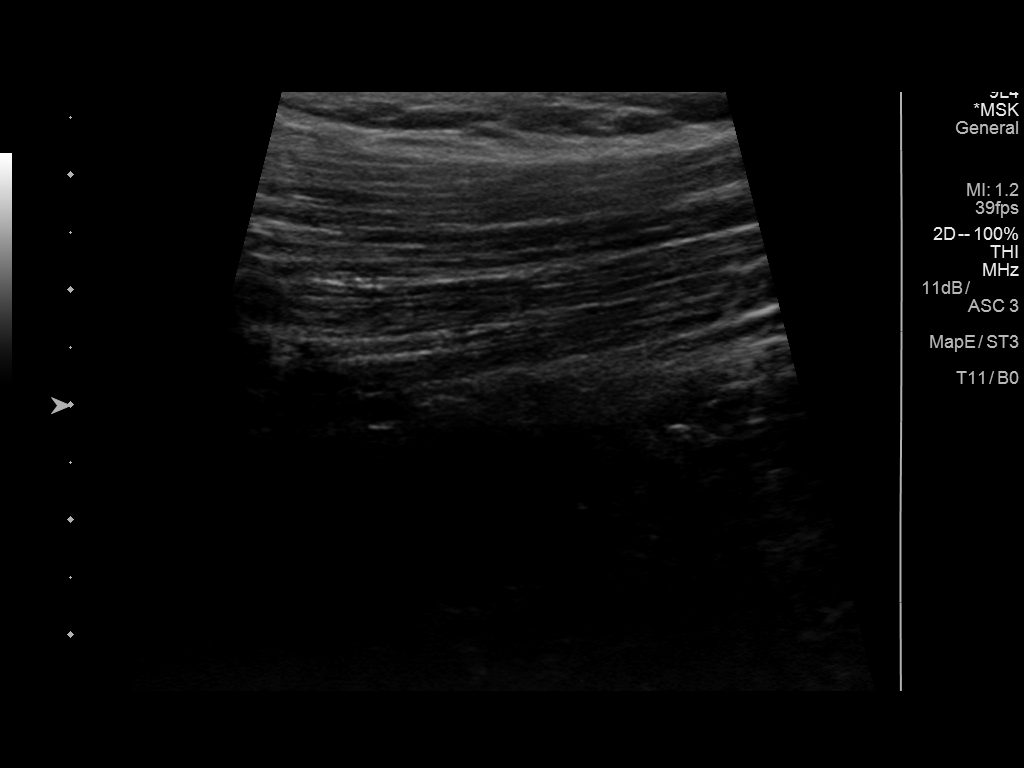
[im 2/7]
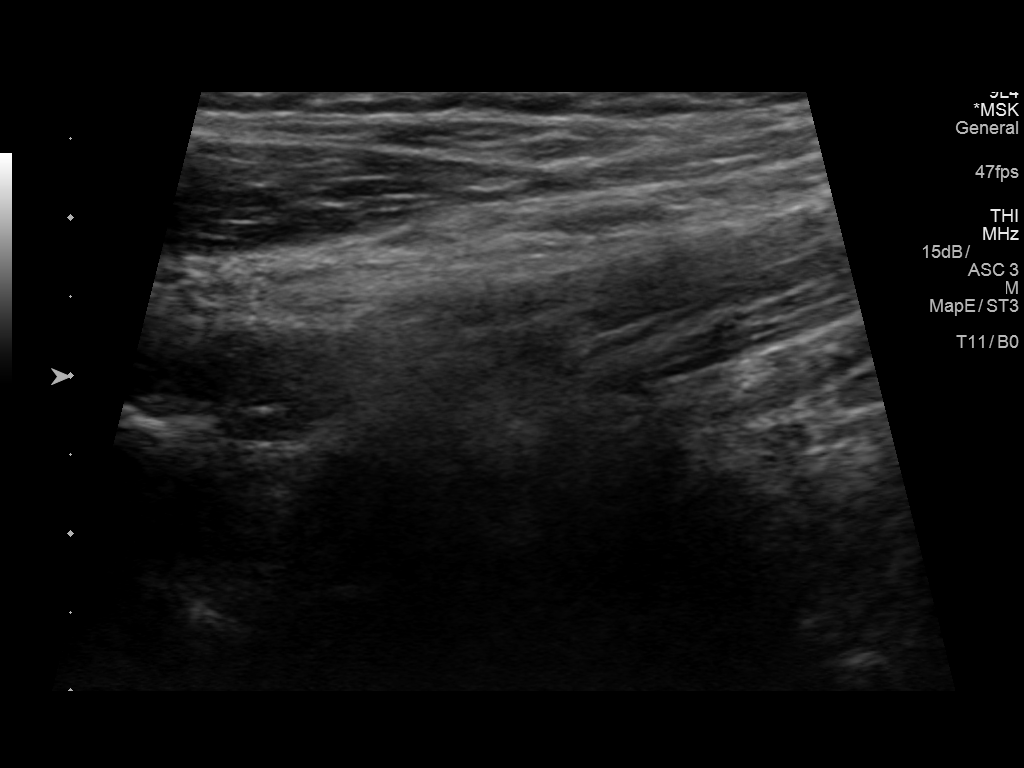
[im 3/7]
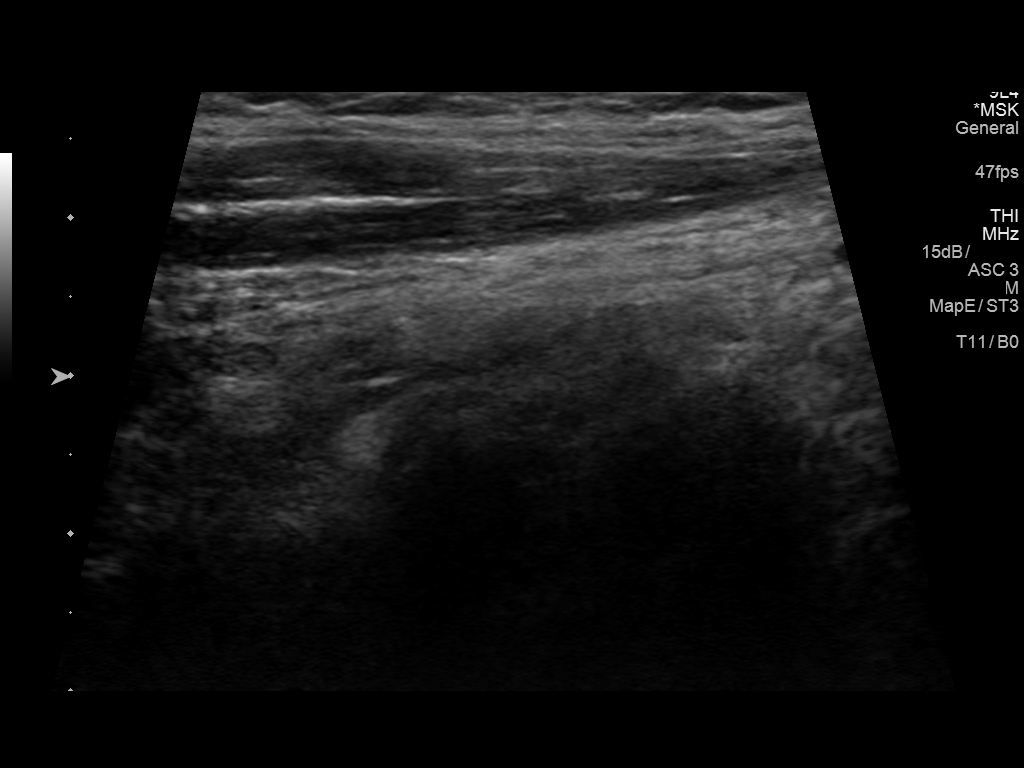
[im 4/7]
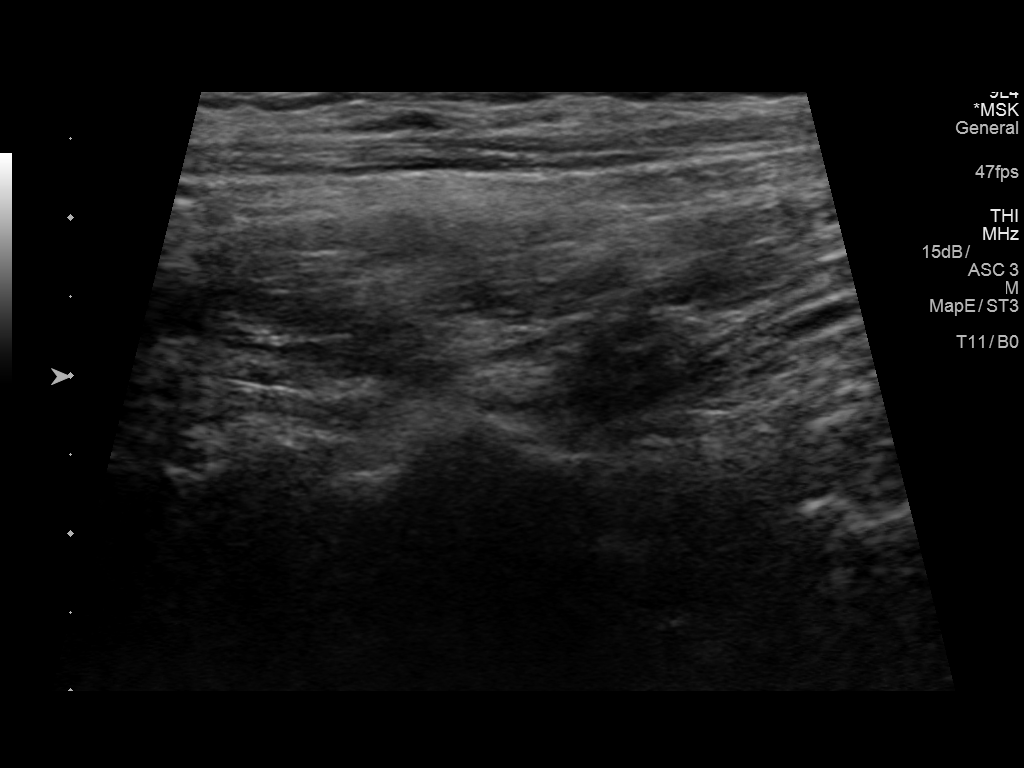
[im 5/7]
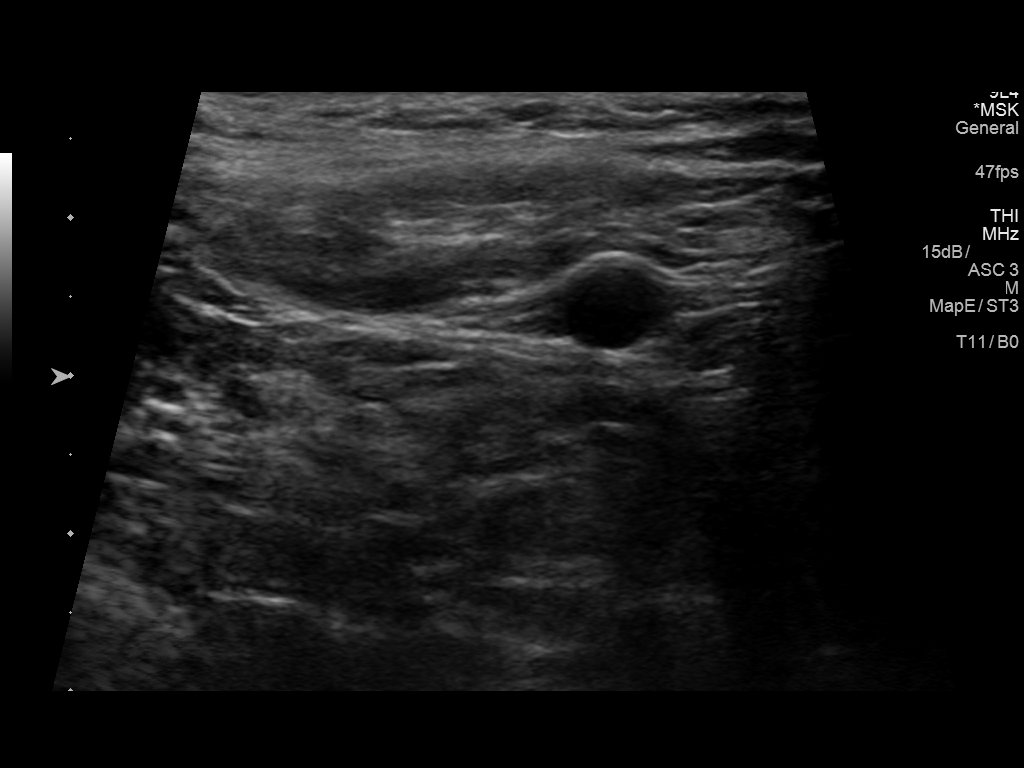
[im 6/7]
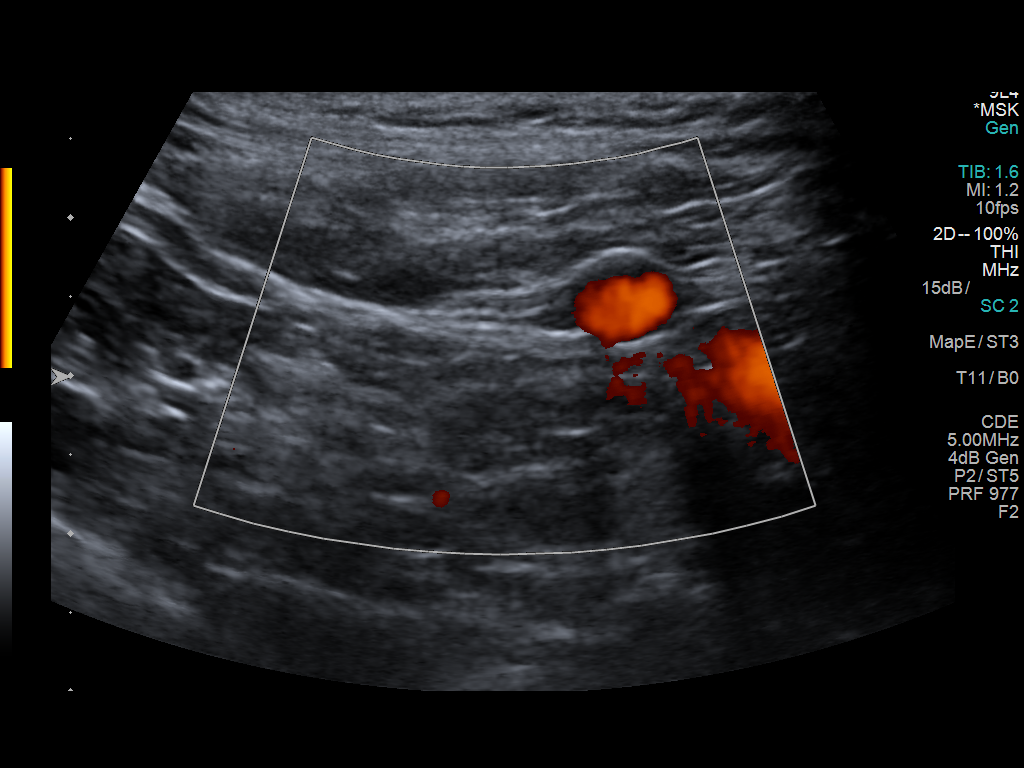
[im 7/7]
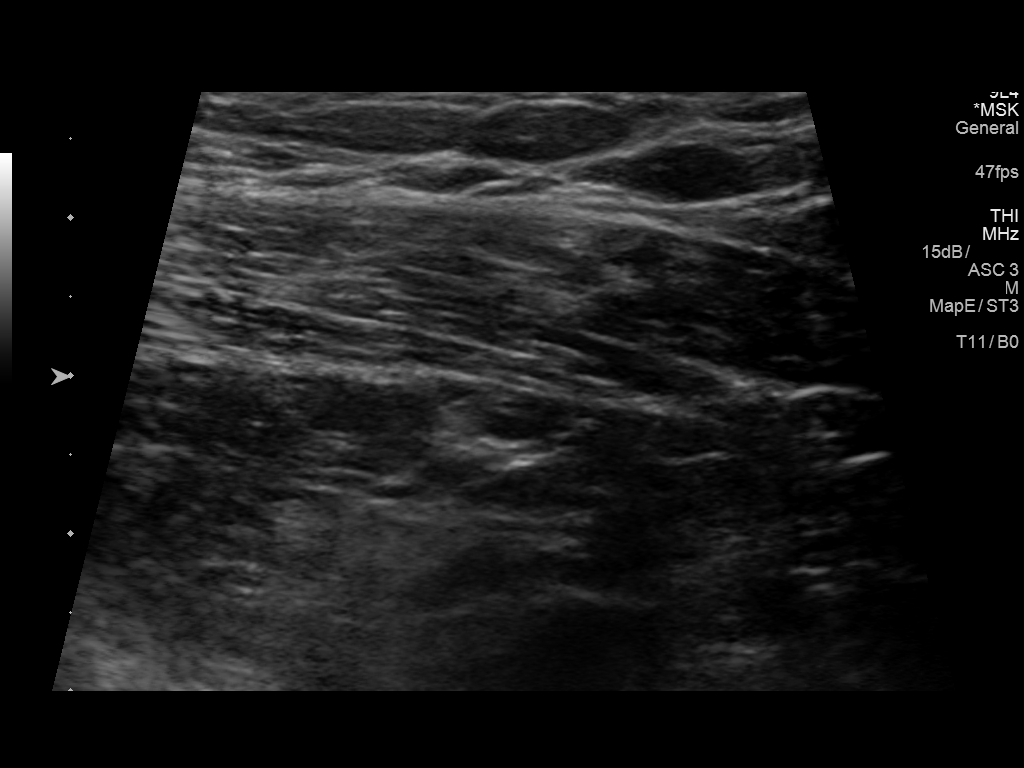

[7 of 7 positions shown; findings below may reference images not displayed]

FINDINGS: No mass, cyst, abscess, aneurysm, adenopathy, or other pathologic
findings on ultrasound in the region of concern.
IMPRESSION: No pathologic findings on ultrasound.

## 2020-01-23 ENCOUNTER — Telehealth: Payer: Self-pay | Admitting: Physician Assistant

## 2020-01-23 NOTE — Telephone Encounter (Signed)
Patient experiencing body aches, low grade fever and diarrhea. Patient states she also has a lump on collar bone.   I advised patient for lump on collar bone go to UC for evaluation and if symptoms lasted more than 48hrs.      Patient given below information from CDC:  Possible Side Effects After Getting a COVID-19 Vaccine Updated Aug. 6, 2021 Print COVID-19 vaccination will help protect you from getting COVID-19. You may have some side effects, which are normal signs that your body is building protection. These side effects may affect your ability to do daily activities, but they should go away in a few days. Some people have no side effects.  Serious side effects that could cause a long-term health problem are extremely unlikely following any vaccination, including COVID-19 vaccination. Vaccine monitoring has historically shown that side effects generally happen within six weeks of receiving a vaccine dose. For this reason, the FDA required each of the authorized COVID-19 vaccines to be studied for at least two months (eight weeks) after the final dose.  Common Side Effects On the arm where you got the shot: WhatExpectafterVaccinationAnimation_pain Pain Redness Swelling Throughout the rest of your body: WhatExpectafterVaccinationAnimation_fever Tiredness Headache Muscle pain Chills Fever Nausea If you had a severe or immediate allergic reaction after getting the first dose of an mRNA COVID-19 vaccine, you should not get a second dose of either of the mRNA COVID-19 vaccines. Learn about getting a different type of vaccine after an allergic reaction.  Helpful Tips to Relieve Side Effects Talk to your doctor about taking over-the-counter medicine, such as ibuprofen, acetaminophen, aspirin, or antihistamines, for any pain and discomfort you may experience after getting vaccinated. You can take these medications to relieve post-vaccination side effects if you have no other medical  reasons that prevent you from taking these medications normally.  It is not recommended you take these medicines before vaccination for the purpose of trying to prevent side effects.  To reduce pain and discomfort where you got the shot WhatExpectafterVaccinationAnimation_movearm Apply a clean, cool, wet washcloth over the area. Use or exercise your arm. To reduce discomfort from fever WhatExpectafterVaccinationAnimation_drink Drink plenty of fluids. Dress lightly. If You Received a Second Shot Side effects after your second shot may be more intense than the ones you experienced after your first shot. These side effects are normal signs that your body is building protection and should go away within a few days.  When to Call the Doctor nurse In most cases, discomfort from pain or fever is a normal sign that your body is building protection. Contact your doctor or healthcare provider:  If the redness or tenderness where you got the shot gets worse after 24 hours If your side effects are worrying you or do not seem to be going away after a few days If you get a COVID-19 vaccine and you think you might be having a severe allergic reaction after leaving the vaccination site, seek immediate medical care by calling 911. Learn more about COVID-19 vaccines and rare severe allergic reactions.

## 2020-01-23 NOTE — Telephone Encounter (Signed)
Patient had COVID vaccine recently had has some clinical questions she would like to discuss, please contact at [phone]

## 2020-03-24 ENCOUNTER — Ambulatory Visit: Payer: Self-pay

## 2020-03-25 ENCOUNTER — Ambulatory Visit (INDEPENDENT_AMBULATORY_CARE_PROVIDER_SITE_OTHER): Payer: 59 | Admitting: Physician Assistant

## 2020-03-25 ENCOUNTER — Encounter: Payer: Self-pay | Admitting: Physician Assistant

## 2020-03-25 ENCOUNTER — Other Ambulatory Visit: Payer: Self-pay

## 2020-03-25 VITALS — Temp 98.7°F | Ht 68.0 in | Wt 197.0 lb

## 2020-03-25 DIAGNOSIS — H9201 Otalgia, right ear: Secondary | ICD-10-CM

## 2020-03-25 DIAGNOSIS — J019 Acute sinusitis, unspecified: Secondary | ICD-10-CM

## 2020-03-25 MED ORDER — CYCLOBENZAPRINE HCL 10 MG PO TABS: 10.0000 mg | ORAL_TABLET | Freq: Three times a day (TID) | ORAL | 0 refills | Status: DC | PRN

## 2020-03-25 MED ORDER — DOXYCYCLINE HYCLATE 100 MG PO TABS: 100.0000 mg | ORAL_TABLET | Freq: Two times a day (BID) | ORAL | 0 refills | Status: DC

## 2020-03-25 MED ORDER — BENZONATATE 100 MG PO CAPS: 100.0000 mg | ORAL_CAPSULE | Freq: Three times a day (TID) | ORAL | 0 refills | Status: DC | PRN

## 2020-03-25 NOTE — Progress Notes (Signed)
Telehealth office visit note for [name] Reid, PA-C- at Primary Care at [facility]   I connected with current patient today by telephone and verified that I am speaking with the correct person   . Location of the patient: Home . Location of the provider: Office - This visit type was conducted due to national recommendations for restrictions regarding the COVID-19 Pandemic (e.g. social distancing) in an effort to limit this patient's exposure and mitigate transmission in our community.    - No physical exam could be performed with this format, beyond that communicated to [place] by the patient/ family members as noted.   - Additionally my office staff/ schedulers were to discuss with the patient that there may be a monetary charge related to this service, depending on their medical insurance.  My understanding is that patient understood and consented to proceed.     _________________________________________________________________________________   History of Present Illness: Patient calls in with upper respiratory symptoms of nasal and chest congestion with green sputum, right earache, chest tightness, sinus pressure, malaise, and productive cough especially in the mornings.  Reports last week was taking care of her nephew who had a cold.  No other sick contact exposures.  States initial symptoms were sore throat and clear runny nose which started 5-6 days ago.  Feels like her symptoms are getting worse instead of better.  Has been taking Sudafed which is no longer helping.  Also taking Robitussin-DM and ibuprofen. Using a humidifier and essential oils at night. Denies fever, night sweats or dyspnea. Also requesting refill of Flexeril which she takes prn for sleep disturbance related to PMR.     No flowsheet data found.  Depression screen [facility] 2/9 03/25/2020 08/30/2018 06/30/2018 01/27/2018 09/22/2017  Decreased Interest 0 0 0 0 0  Down, Depressed, Hopeless 0 0 0 0 0  PHQ - 2 Score 0 0 0 0 0   Altered sleeping 0 3 0 0 0  Tired, decreased energy 0 0 0 0 0  Change in appetite 0 0 0 0 0  Feeling bad or failure about yourself  0 0 0 0 0  Trouble concentrating 0 0 0 0 0  Moving slowly or fidgety/restless 0 0 0 0 0  Suicidal thoughts 0 0 0 0 0  PHQ-9 Score 0 3 0 0 0  Difficult doing work/chores - Not difficult at all - - -  Some recent data might be hidden      Impression and Recommendations:     1. Acute non-recurrent sinusitis, unspecified location   2. Earache on right     Acute non-recurrent sinusitis, Right earache:  -Due to progression of symptoms and severity will start antibiotic therapy.  Per chart review, patient has tolerated doxycycline in the past for sinusitis so will start doxycycline 100 mg twice daily and tessalon perles for cough.  -Continue with home supportive therapy. -Advised if symptoms fail to improve or worsen recommend Covid testing. Pt verbalized understanding.  -Recommend to schedule CPE/FBW in the near future.    - As part of my medical decision making, I reviewed the following data within the [place] History obtained from pt /family, CMA notes reviewed and incorporated if applicable, Labs reviewed, Radiograph/ tests reviewed if applicable and OV notes from prior OV's with me, as well as any other specialists she/he has seen since seeing me last, were all reviewed and used in my medical decision making process today.    -  Additionally, when appropriate, discussion had with patient regarding our treatment plan, and their biases/concerns about that plan were used in my medical decision making today.    - The patient agreed with the plan and demonstrated an understanding of the instructions.   No barriers to understanding were identified.     - The patient was advised to call back or seek an in-person evaluation if the symptoms worsen or if the condition fails to improve as anticipated.   Return if symptoms worsen or fail to  improve.    No orders of the defined types were placed in this encounter.   Meds ordered this encounter  Medications  . cyclobenzaprine (FLEXERIL) 10 MG tablet    Sig: Take 1 tablet (10 mg total) by mouth 3 (three) times daily as needed for muscle spasms. Reported on 11/12/2015    Dispense:  30 tablet    Refill:  0  . doxycycline (VIBRA-TABS) 100 MG tablet    Sig: Take 1 tablet (100 mg total) by mouth 2 (two) times daily.    Dispense:  20 tablet    Refill:  0    Order Specific Question:   Supervising Provider    Answer:   Beatrice Lecher D [[id number]]  . benzonatate (TESSALON) 100 MG capsule    Sig: Take 1 capsule (100 mg total) by mouth 3 (three) times daily as needed for cough.    Dispense:  30 capsule    Refill:  0    Order Specific Question:   Supervising Provider    Answer:   Beatrice Lecher D [[id number]]    Medications Discontinued During This Encounter  Medication Reason  . cyclobenzaprine (FLEXERIL) 10 MG tablet Reorder       Time spent on visit including pre-visit chart review and post-visit care was 15 minutes.      The [place] was signed into law in 2016 which includes the topic of electronic health records.  This provides immediate access to information in MyChart.  This includes consultation notes, operative notes, office notes, lab results and pathology reports.  If you have any questions about what you read please let us know at your next visit or call us at the office.  We are right here with you.  Note:  This note was prepared with assistance of Dragon voice recognition software. Occasional wrong-word or sound-a-like substitutions may have occurred due to the inherent limitations of voice recognition software.   __________________________________________________________________________________     Patient Care Team    Relationship Specialty Notifications Start End  [name] Orr, [place] PCP - General Physician Assistant  01/23/20    Obgyn, Erling Conte    01/07/17      -Vitals obtained; medications/ allergies reconciled;  personal medical, social, Sx etc.histories were updated by CMA, reviewed by me and are reflected in chart   Patient Active Problem List   Diagnosis Date Noted  . Fibromyalgia 09/06/2018  . Acute hip pain, left 08/30/2018  . Pain in deltoid, bilateral 08/30/2018  . Tinnitus of both ears 06/30/2018  . Stress fracture, left foot, initial encounter for fracture 04/15/2018  . Acute foot pain, left 04/11/2018  . Acute maxillary sinusitis 01/27/2018  . Soft tissue swelling 05/20/2017  . Migraine without status migrainosus, not intractable 05/20/2017  . Body mass index (bmi) 31.0-31.9, adult 04/22/2017  . Claustrophobia 04/22/2017  . Lumbar back pain 01/21/2017  . Chronic left shoulder pain 01/21/2017  . Healthcare maintenance 01/07/2017  . Left flank pain 09/25/2015  .  Abnormal CT of liver 09/25/2015  . Renal stones 09/18/2015  . Intractable pain 09/13/2015  . Chest pain, atypical 01/10/2014  . Neck pain 01/10/2014  . Dizziness 01/10/2014  . Fever blister 07/06/2013  . Neck pain, bilateral 02/06/2013  . Depression 10/12/2010  . Migraine aura, persistent 10/12/2010  . Endometriosis 10/10/2010     Current Meds  Medication Sig  . cyclobenzaprine (FLEXERIL) 10 MG tablet Take 1 tablet (10 mg total) by mouth 3 (three) times daily as needed for muscle spasms. Reported on 11/12/2015  . Multiple Vitamins-Minerals (MULTIVITAMIN WITH MINERALS) tablet Take 1 tablet by mouth daily.  . valACYclovir (VALTREX) 1000 MG tablet TAKE TWO TABLETS BY MOUTH TWICE DAILY  . [DISCONTINUED] cyclobenzaprine (FLEXERIL) 10 MG tablet Take 1 tablet (10 mg total) by mouth 3 (three) times daily as needed for muscle spasms. Reported on 11/12/2015     Allergies:  Allergies  Allergen Reactions  . Albuterol Other (See Comments)    Headaches  . Penicillins Hives and Other (See Comments)    Has patient had a PCN reaction  causing immediate rash, facial/tongue/throat swelling, SOB or lightheadedness with hypotension: Yes Has patient had a PCN reaction causing severe rash involving mucus membranes or skin necrosis: No Has patient had a PCN reaction that required hospitalization No Has patient had a PCN reaction occurring within the last 10 years: No If all of the above answers are "NO", then may proceed with Cephalosporin use.      ROS:  See above HPI for pertinent positives and negatives   Objective:   Temperature 98.7 F (37.1 C), height 5\' 8"  (1.727 m), weight 197 lb (89.4 kg), last menstrual period 10/16/2010.  (if some vitals are omitted, this means that patient was UNABLE to obtain them even though they were asked to get them prior to OV today.  They were asked to call us at their earliest convenience with these once obtained. ) General: A & O * 3; sounds in no acute distress; congested Respiratory: speaking in full sentences, no conversational dyspnea Psych: insight appears good, mood- appears full

## 2020-06-07 ENCOUNTER — Encounter: Payer: Self-pay | Admitting: Physician Assistant

## 2020-06-07 ENCOUNTER — Telehealth: Payer: Self-pay | Admitting: Physician Assistant

## 2020-06-07 ENCOUNTER — Ambulatory Visit (INDEPENDENT_AMBULATORY_CARE_PROVIDER_SITE_OTHER): Payer: 59 | Admitting: Physician Assistant

## 2020-06-07 VITALS — Ht 68.0 in | Wt 193.0 lb

## 2020-06-07 DIAGNOSIS — M62830 Muscle spasm of back: Secondary | ICD-10-CM

## 2020-06-07 DIAGNOSIS — J069 Acute upper respiratory infection, unspecified: Secondary | ICD-10-CM

## 2020-06-07 DIAGNOSIS — J40 Bronchitis, not specified as acute or chronic: Secondary | ICD-10-CM | POA: Diagnosis not present

## 2020-06-07 MED ORDER — BENZONATATE 100 MG PO CAPS: 100.0000 mg | ORAL_CAPSULE | Freq: Three times a day (TID) | ORAL | 0 refills | Status: DC | PRN

## 2020-06-07 MED ORDER — CYCLOBENZAPRINE HCL 10 MG PO TABS: 10.0000 mg | ORAL_TABLET | Freq: Three times a day (TID) | ORAL | 1 refills | Status: DC | PRN

## 2020-06-07 MED ORDER — DOXYCYCLINE HYCLATE 100 MG PO TABS: 100.0000 mg | ORAL_TABLET | Freq: Two times a day (BID) | ORAL | 0 refills | Status: DC

## 2020-06-07 NOTE — Progress Notes (Signed)
Telehealth office visit note for [name] Reid, PA-C- at Primary Care at [facility]   I connected with current patient today by telephone and verified that I am speaking with the correct person   . Location of the patient: Home . Location of the provider: Office - This visit type was conducted due to national recommendations for restrictions regarding the COVID-19 Pandemic (e.g. social distancing) in an effort to limit this patient's exposure and mitigate transmission in our community.    - No physical exam could be performed with this format, beyond that communicated to [place] by the patient/ family members as noted.   - Additionally my office staff/ schedulers were to discuss with the patient that there may be a monetary charge related to this service, depending on their medical insurance.  My understanding is that patient understood and consented to proceed.     _________________________________________________________________________________   History of Present Illness: Patient calls in with complaints of productive cough with green sputum and chest congestion.  Symptoms started Monday (4 days ago) with runny nose and congestion which then progressed to cough and chest congestion.  Yesterday had a headache in the morning and took Tylenol with ibuprofen which helped relieve headache.  One day had a low-grade fever of 100.0.  Has been taking Robitussin-DM which has helped some. Has been taking Tessalon Perles from previous sinus infection which has help with cough at night. Denies sore throat, recent known exposures, shortness of breath or palpitations.  Did a home COVID test yesterday which resulted negative.  Has a [facility] COVID test on the way for retesting.  Is fully vaccinated against COVID.  Flexeril has helped with back spasms and is requesting a refill.    No flowsheet data found.  Depression screen [facility] 2/9 06/07/2020 03/25/2020 08/30/2018 06/30/2018 01/27/2018  Decreased Interest 0 0  0 0 0  Down, Depressed, Hopeless 0 0 0 0 0  PHQ - 2 Score 0 0 0 0 0  Altered sleeping 1 0 3 0 0  Tired, decreased energy 0 0 0 0 0  Change in appetite 0 0 0 0 0  Feeling bad or failure about yourself  0 0 0 0 0  Trouble concentrating 0 0 0 0 0  Moving slowly or fidgety/restless 0 0 0 0 0  Suicidal thoughts 0 0 0 0 0  PHQ-9 Score 1 0 3 0 0  Difficult doing work/chores Not difficult at all - Not difficult at all - -  Some recent data might be hidden      Impression and Recommendations:     1. Upper respiratory tract infection, unspecified type   2. Bronchitis   3. Spasm of back muscles     Upper respiratory tract infection, unspecified type, Bronchitis: -Discussed with patient recommend retesting for COVID-19 and pending results follow CDC quarantine/isolation guidelines. -Symptoms have mildly improved and recommend to continue with home supportive care. Will provide refill of Tessalon Perles. Will send prescription for doxycycline and advised to fill prescription if symptoms fail to improve or worsen. Recommend to monitor for red flag signs/symptoms such as shortness of breath, chest pain, or confusion.  Spasm of back muscles: -Will provide refill of Flexeril. -Recommend to apply heat therapy as needed.  - As part of my medical decision making, I reviewed the following data within the [place] History obtained from pt /family, CMA notes reviewed and incorporated if applicable, Labs reviewed, Radiograph/ tests reviewed if applicable  and OV notes from prior OV's with me, as well as any other specialists she/he has seen since seeing me last, were all reviewed and used in my medical decision making process today.    - Additionally, when appropriate, discussion had with patient regarding our treatment plan, and their biases/concerns about that plan were used in my medical decision making today.    - The patient agreed with the plan and demonstrated an understanding of  the instructions.   No barriers to understanding were identified.     - The patient was advised to call back or seek an in-person evaluation if the symptoms worsen or if the condition fails to improve as anticipated.   Return if symptoms worsen or fail to improve.    No orders of the defined types were placed in this encounter.   Meds ordered this encounter  Medications  . benzonatate (TESSALON) 100 MG capsule    Sig: Take 1 capsule (100 mg total) by mouth 3 (three) times daily as needed for cough.    Dispense:  30 capsule    Refill:  0    Order Specific Question:   Supervising Provider    Answer:   Beatrice Lecher D [[id number]]  . cyclobenzaprine (FLEXERIL) 10 MG tablet    Sig: Take 1 tablet (10 mg total) by mouth 3 (three) times daily as needed for muscle spasms. Reported on 11/12/2015    Dispense:  30 tablet    Refill:  1    Order Specific Question:   Supervising Provider    Answer:   Beatrice Lecher D [[id number]]  . doxycycline (VIBRA-TABS) 100 MG tablet    Sig: Take 1 tablet (100 mg total) by mouth 2 (two) times daily.    Dispense:  14 tablet    Refill:  0    Order Specific Question:   Supervising Provider    Answer:   Beatrice Lecher D [[id number]]    Medications Discontinued During This Encounter  Medication Reason  . cyclobenzaprine (FLEXERIL) 10 MG tablet Reorder  . doxycycline (VIBRA-TABS) 100 MG tablet Reorder  . benzonatate (TESSALON) 100 MG capsule Reorder       Time spent on visit including pre-visit chart review and post-visit care was 10 minutes.      The [place] was signed into law in 2016 which includes the topic of electronic health records.  This provides immediate access to information in MyChart.  This includes consultation notes, operative notes, office notes, lab results and pathology reports.  If you have any questions about what you read please let us know at your next visit or call us at the office.  We are right here with  you.  Note:  This note was prepared with assistance of Dragon voice recognition software. Occasional wrong-word or sound-a-like substitutions may have occurred due to the inherent limitations of voice recognition software.   __________________________________________________________________________________     Patient Care Team    Relationship Specialty Notifications Start End  [name] Orr, [place] PCP - General Physician Assistant  01/23/20   Obgyn, Erling Conte    01/07/17      -Vitals obtained; medications/ allergies reconciled;  personal medical, social, Sx etc.histories were updated by CMA, reviewed by me and are reflected in chart   Patient Active Problem List   Diagnosis Date Noted  . Fibromyalgia 09/06/2018  . Acute hip pain, left 08/30/2018  . Pain in deltoid, bilateral 08/30/2018  . Tinnitus of both ears 06/30/2018  . Stress fracture,  left foot, initial encounter for fracture 04/15/2018  . Acute foot pain, left 04/11/2018  . Acute maxillary sinusitis 01/27/2018  . Soft tissue swelling 05/20/2017  . Migraine without status migrainosus, not intractable 05/20/2017  . Body mass index (bmi) 31.0-31.9, adult 04/22/2017  . Claustrophobia 04/22/2017  . Lumbar back pain 01/21/2017  . Chronic left shoulder pain 01/21/2017  . Healthcare maintenance 01/07/2017  . Left flank pain 09/25/2015  . Abnormal CT of liver 09/25/2015  . Renal stones 09/18/2015  . Intractable pain 09/13/2015  . Chest pain, atypical 01/10/2014  . Neck pain 01/10/2014  . Dizziness 01/10/2014  . Fever blister 07/06/2013  . Neck pain, bilateral 02/06/2013  . Depression 10/12/2010  . Migraine aura, persistent 10/12/2010  . Endometriosis 10/10/2010     Current Meds  Medication Sig  . Multiple Vitamins-Minerals (MULTIVITAMIN WITH MINERALS) tablet Take 1 tablet by mouth daily.  . Vitamin D, Ergocalciferol, (DRISDOL) 1.25 MG (50000 UT) CAPS capsule Take 1 capsule (50,000 Units total) by mouth every 7  (seven) days.  . [DISCONTINUED] cyclobenzaprine (FLEXERIL) 10 MG tablet Take 1 tablet (10 mg total) by mouth 3 (three) times daily as needed for muscle spasms. Reported on 11/12/2015     Allergies:  Allergies  Allergen Reactions  . Albuterol Other (See Comments)    Headaches  . Penicillins Hives and Other (See Comments)    Has patient had a PCN reaction causing immediate rash, facial/tongue/throat swelling, SOB or lightheadedness with hypotension: Yes Has patient had a PCN reaction causing severe rash involving mucus membranes or skin necrosis: No Has patient had a PCN reaction that required hospitalization No Has patient had a PCN reaction occurring within the last 10 years: No If all of the above answers are "NO", then may proceed with Cephalosporin use.      ROS:  See above HPI for pertinent positives and negatives   Objective:   Height 5\' 8"  (1.727 m), weight 193 lb (87.5 kg), last menstrual period 10/16/2010.  (if some vitals are omitted, this means that patient was UNABLE to obtain them. ) General: A & O * 3; sounds in no acute distress Respiratory: speaking in full sentences, no conversational dyspnea Psych: insight appears good, mood- appears full

## 2020-06-07 NOTE — Telephone Encounter (Signed)
Per Herb Grays patient needs evaluation before sending med into pharmacy. Patient can be added today at 11am for telehealth apt. AS, CMA

## 2020-08-12 LAB — HM MAMMOGRAPHY

## 2020-12-19 ENCOUNTER — Ambulatory Visit: Payer: 59 | Admitting: Dermatology

## 2021-01-08 ENCOUNTER — Other Ambulatory Visit: Payer: Self-pay | Admitting: Physician Assistant

## 2021-01-08 DIAGNOSIS — M62830 Muscle spasm of back: Secondary | ICD-10-CM

## 2021-02-17 ENCOUNTER — Ambulatory Visit (INDEPENDENT_AMBULATORY_CARE_PROVIDER_SITE_OTHER): Payer: 59 | Admitting: Dermatology

## 2021-02-17 ENCOUNTER — Other Ambulatory Visit: Payer: Self-pay

## 2021-02-17 ENCOUNTER — Other Ambulatory Visit: Payer: Self-pay | Admitting: Physician Assistant

## 2021-02-17 DIAGNOSIS — L570 Actinic keratosis: Secondary | ICD-10-CM | POA: Diagnosis not present

## 2021-02-17 DIAGNOSIS — B009 Herpesviral infection, unspecified: Secondary | ICD-10-CM

## 2021-02-17 DIAGNOSIS — M62830 Muscle spasm of back: Secondary | ICD-10-CM

## 2021-02-17 DIAGNOSIS — L814 Other melanin hyperpigmentation: Secondary | ICD-10-CM

## 2021-02-17 DIAGNOSIS — L578 Other skin changes due to chronic exposure to nonionizing radiation: Secondary | ICD-10-CM | POA: Diagnosis not present

## 2021-02-17 DIAGNOSIS — Z1283 Encounter for screening for malignant neoplasm of skin: Secondary | ICD-10-CM

## 2021-02-17 DIAGNOSIS — L820 Inflamed seborrheic keratosis: Secondary | ICD-10-CM | POA: Diagnosis not present

## 2021-02-17 DIAGNOSIS — D1800 Hemangioma unspecified site: Secondary | ICD-10-CM

## 2021-02-17 DIAGNOSIS — Q825 Congenital non-neoplastic nevus: Secondary | ICD-10-CM | POA: Diagnosis not present

## 2021-02-17 DIAGNOSIS — L821 Other seborrheic keratosis: Secondary | ICD-10-CM

## 2021-02-17 DIAGNOSIS — D229 Melanocytic nevi, unspecified: Secondary | ICD-10-CM

## 2021-02-17 MED ORDER — SITAVIG 50 MG BU TABS: ORAL_TABLET | BUCCAL | 1 refills | Status: DC

## 2021-02-17 NOTE — Patient Instructions (Signed)

## 2021-02-17 NOTE — Progress Notes (Signed)
Follow-Up Visit   Subjective  [name] Orr is a 50 y.o. female who presents for the following: Annual Exam (Patient has noticed a lesion on her right check and L temple that she would like checked and treated today - irregular appearing, scaly). The patient presents for Total-Body Skin Exam (TBSE) for skin cancer screening and mole check.  The following portions of the chart were reviewed this encounter and updated as appropriate:   Tobacco  Allergies  Meds  Problems  Med Hx  Surg Hx  Fam Hx     Review of Systems:  No other skin or systemic complaints except as noted in HPI or Assessment and Plan.  Objective  Well appearing patient in no apparent distress; mood and affect are within normal limits.  A full examination was performed including scalp, head, eyes, ears, nose, lips, neck, chest, axillae, abdomen, back, buttocks, bilateral upper extremities, bilateral lower extremities, hands, feet, fingers, toes, fingernails, and toenails. All findings within normal limits unless otherwise noted below.  R chest x 1 Erythematous thin papules/macules with gritty scale.   R abdomen Brown plaque.     L forehead/temple x 1 Erythematous keratotic or waxy stuck-on papule or plaque.   Lips Ulceration.   Assessment & Plan  AK (actinic keratosis) R chest x 1 Destruction of lesion - R chest x 1 Complexity: simple   Destruction method: cryotherapy   Informed consent: discussed and consent obtained   Timeout:  patient name, date of birth, surgical site, and procedure verified Lesion destroyed using liquid nitrogen: Yes   Region frozen until ice ball extended beyond lesion: Yes   Outcome: patient tolerated procedure well with no complications   Post-procedure details: wound care instructions given    Congenital non-neoplastic nevus R abdomen See photo  benign-appearing.  Observation.  Call clinic for new or changing lesions.  Recommend daily use of broad spectrum spf  30+ sunscreen to sun-exposed areas.   Inflamed seborrheic keratosis L forehead/temple x 1 Destruction of lesion - L forehead/temple x 1 Complexity: simple   Destruction method: cryotherapy   Informed consent: discussed and consent obtained   Timeout:  patient name, date of birth, surgical site, and procedure verified Lesion destroyed using liquid nitrogen: Yes   Region frozen until ice ball extended beyond lesion: Yes   Outcome: patient tolerated procedure well with no complications   Post-procedure details: wound care instructions given    Herpes simplex Lips Discussed Sitivig - will send in Herpes Simplex Virus = Cold Sores = Fever Blisters is a chronic recurring blistering; scabbing sore-producing viral infection that is recurrent usually in the same area triggered by stress, sun/UV exposure and trauma.  It is infectious and can be spread from person to person by direct contact.  It is not curable, but is treatable with topical and oral medication.  Acyclovir (SITAVIG) 50 MG TABS - Lips Apply one tab to gum line at site of fever blister and let dissolve.  Skin cancer screening  Lentigines - Scattered tan macules - Due to sun exposure - Benign-appearing, observe - Recommend daily broad spectrum sunscreen SPF 30+ to sun-exposed areas, reapply every 2 hours as needed. - Call for any changes  Seborrheic Keratoses - Stuck-on, waxy, tan-brown papules and/or plaques  - Benign-appearing - Discussed benign etiology and prognosis. - Observe - Call for any changes  Melanocytic Nevi - Tan-brown and/or pink-flesh-colored symmetric macules and papules - Benign appearing on exam today - Observation - Call clinic for new or  changing moles - Recommend daily use of broad spectrum spf 30+ sunscreen to sun-exposed areas.   Hemangiomas - Red papules - Discussed benign nature - Observe - Call for any changes  Actinic Damage - Chronic condition, secondary to cumulative UV/sun  exposure - diffuse scaly erythematous macules with underlying dyspigmentation - Recommend daily broad spectrum sunscreen SPF 30+ to sun-exposed areas, reapply every 2 hours as needed.  - Staying in the shade or wearing long sleeves, sun glasses (UVA+UVB protection) and wide brim hats (4-inch brim around the entire circumference of the hat) are also recommended for sun protection.  - Call for new or changing lesions.  Skin cancer screening performed today.  Return in about 6 months (around 08/17/2021) for AK and ISK recheck .  Luther Redo, CMA, am acting as scribe for Sarina Ser, MD . Documentation: I have reviewed the above documentation for accuracy and completeness, and I agree with the above.  Sarina Ser, MD

## 2021-02-20 ENCOUNTER — Other Ambulatory Visit: Payer: Self-pay

## 2021-02-20 ENCOUNTER — Encounter: Payer: Self-pay | Admitting: Dermatology

## 2021-02-20 ENCOUNTER — Encounter: Payer: Self-pay | Admitting: Physician Assistant

## 2021-02-20 ENCOUNTER — Ambulatory Visit (INDEPENDENT_AMBULATORY_CARE_PROVIDER_SITE_OTHER): Payer: 59 | Admitting: Physician Assistant

## 2021-02-20 VITALS — BP 119/83 | HR 72 | Temp 98.8°F | Ht 68.0 in | Wt 217.0 lb

## 2021-02-20 DIAGNOSIS — M5441 Lumbago with sciatica, right side: Secondary | ICD-10-CM | POA: Diagnosis not present

## 2021-02-20 DIAGNOSIS — G8929 Other chronic pain: Secondary | ICD-10-CM

## 2021-02-20 NOTE — Patient Instructions (Signed)
Managing Chronic Back Pain Chronic back pain is back pain that lasts for 12 weeks or longer. It often affects the lower back. Back pain may feel like a muscle ache or a sharp, stabbing pain. It can be mild, moderate, or severe. If you have been diagnosed with chronic back pain, there are things you can do to manage your symptoms. You may have to try different things to see what works best for you. Your health care provider may also give you specific instructions. How to manage lifestyle changes Treating chronic back pain often starts with rest and pain relief, followed by exercises to restore movement and strength to your back (physical therapy). You may need surgery if other treatments do not help, or if your pain is caused by a condition or an injury. Follow your treatment plan as told by your health care provider. This may include: Relaxation techniques. Talk therapy or counseling with a mental health specialist. A form of talk therapy called cognitive behavioral therapy (CBT) can be especially helpful. This therapy helps you set goals and follow up on the changes that you make. Acupuncture or massage therapy. Local electrical stimulation. Injections. These deliver numbing or pain-relieving medicines into your spine or the area of pain. How to recognize changes in your chronic back pain Your condition may improve with treatment. However, back pain may not go away or may get worse over time. Watch your symptoms carefully and let your health care provider know if your symptoms get worse or do not improve. Your back pain may be getting worse if you have: Pain that begins to cause problems with posture. Pain that gets worse when you are sitting, standing, walking, bending, or lifting. Pain that affects you while you are active, or at rest, or both. Pain that eventually makes it hard to move around (limits mobility). Pain that occurs with fever, weight loss, or difficulty urinating. Pain that causes  numbness and tingling. How to use body mechanics and posture to help with pain Healthy body mechanics and good posture can help to relieve stress on your back. Body mechanics refers to the movements and positions of your body during your daily activities. Posture is part of body mechanics. Good posture means: Your spine is in its natural S-curve, or neutral, position. Your shoulders are pulled back slightly. Your head is not tipped forward. Follow these guidelines to improve your posture and body mechanics in your everyday activities. Standing  When standing, keep your spine neutral and your feet about hip-width apart. Keep your knees slightly bent. Your ears, shoulders, and hips should line up. When you do a task in which you stand in one place for a long time, place one foot on a stable object that is 2-4 inches (5-10 cm) high, such as a footstool. This helps keep your spine neutral. Sitting  When sitting, keep your spine neutral and your feet flat on the floor. Use a footrest, if necessary, and keep your thighs parallel to the floor. Avoid rounding your shoulders, and avoid tilting your head forward. When working at a desk or a computer, keep your desk at a height where your hands are slightly lower than your elbows. Slide your chair under your desk so you are close enough to maintain good posture. When working at a computer, place your monitor at a height where you are looking straight ahead and you do not have to tilt your head forward or downward to view the screen. Lifting  Keep your feet at   least shoulder-width apart and tighten the muscles of your abdomen. Bend your knees and hips and keep your spine neutral. Be sure to lift using the strength of your legs, not your back. Do not lock your knees straight out. Always ask for help to lift heavy or awkward objects. Resting  When lying down and resting, avoid positions that are most painful. If you have pain with activities such as  sitting, bending, stooping, or squatting, lie in a position in which your body does not bend very much. For example, avoid curling up on your side with your arms and knees near your chest (fetal position). If you have pain with activities such as standing for a long time or reaching with your arms, lie with your spine in a neutral position and bend your knees slightly. Try: Lying on your side with a pillow between your knees. Lying on your back with a pillow under your knees. Follow these instructions at home: Medicines Treatment may include over-the-counter or prescription medicines for pain and inflammation that are taken by mouth or applied to the skin. Another treatment may include muscle relaxants. Take over-the-counter and prescription medicines only as told by your health care provider. Ask your health care provider if the medicine prescribed to you: Requires you to avoid driving or using machinery. Can cause constipation. You may need to take these actions to prevent or treat constipation: Drink enough fluid to keep your urine pale yellow. Take over-the-counter or prescription medicines. Eat foods that are high in fiber, such as beans, whole grains, and fresh fruits and vegetables. Limit foods that are high in fat and processed sugars, such as fried or sweet foods. Lifestyle Do not use any products that contain nicotine or tobacco, such as cigarettes, e-cigarettes, and chewing tobacco. If you need help quitting, ask your health care provider. Eat a healthy diet that includes foods such as vegetables, fruits, fish, and lean meats. Work with your health care provider to achieve or maintain a healthy weight. General instructions Get regular exercise as told. Exercise improves flexibility and strength. If physical therapy was prescribed, do exercises as told by your health care provider. Use ice or heat therapy as told by your health care provider. Keep all follow-up visits as told by your  health care provider. This is important. Where can I get support? Consider joining a support group for people managing chronic back pain. Ask your health care provider about support groups in your area. You can also find online and in-person support groups through: The American Chronic Pain Association: theacpa.org Pain Connection Program: painconnection.org Contact a health care provider if: You have pain that is not relieved with rest or medicine. Your pain gets worse, or you have new pain. You have a fever. You have rapid weight loss. You have trouble doing your normal activities. Get help right away if: You have weakness or numbness in one or both of your legs or feet. You have trouble controlling your bladder or your bowels. You have severe back pain and have any of the following: Nausea or vomiting. Abdominal pain. Shortness of breath or you faint. Summary Chronic back pain is often treated with rest, pain relief, and physical therapy. Talk therapy, acupuncture, massage, and local electrical stimulation may help. Follow your treatment plan as told by your health care provider. Joining a support group may help you manage chronic back pain. This information is not intended to replace advice given to you by your health care provider. Make sure you   discuss any questions you have with your health care provider. Document Revised: 06/22/2019 Document Reviewed: 02/28/2019 Elsevier Patient Education  [place].

## 2021-02-20 NOTE — Progress Notes (Signed)
Acute Office Visit  Subjective:    Patient ID: [name] Orr, female    DOB: January 21, 1971, 50 y.o.   MRN: [id number]  Chief Complaint  Patient presents with   Acute Visit   Back Pain    Back Pain Patient is in today for c/o intermittent low back pain for several months. Pain onset is with movement especially at nighttime. States having trouble sleeping. When her pain flares-up, her pain is 10/10.  Has tried back exercises and stretches, heating pad, and Advil/Tylenol. Also reports numbness and tingling sensation down her leg. No bowel or bladder incontinence, fever or fall/injury. Patient ran out of muscle relaxer and recently got it refilled and states will be getting her medication in the mail today.    Past Medical History:  Diagnosis Date   Actinic keratosis    Anemia    history   Anxiety    no meds   Blood in stool    bright red each time has BM for atleast 6 mths   Chronic back pain greater than 3 months duration    Depression    Endometriosis    Fainting spell    GERD (gastroesophageal reflux disease)    Headache(784.0)    History of chicken pox    Migraine    Renal disorder    kidney stones    Past Surgical History:  Procedure Laterality Date   ABDOMINAL HYSTERECTOMY     BACK SURGERY  [phone]   Dr Glenna Fellows   colonscopy     DIAGNOSTIC LAPAROSCOPY     x 2   DILATION AND CURETTAGE OF UTERUS     KNEE SURGERY  2009   lateral realignment to lt knee   laproscopy     svd     x 1    Family History  Problem Relation Age of Onset   Hyperlipidemia Mother    Hypertension Mother    Alcohol abuse Father    Heart disease Father    Stroke Father    Kidney disease Father     Social History   Socioeconomic History   Marital status: Single    Spouse name: Not on file   Number of children: Not on file   Years of education: Not on file   Highest education level: Not on file  Occupational History   Not on file  Tobacco Use   Smoking status:  Never   Smokeless tobacco: Never  Vaping Use   Vaping Use: Never used  Substance and Sexual Activity   Alcohol use: Yes    Alcohol/week: 0.0 standard drinks    Comment: 3-4 glasses of wine per month   Drug use: No   Sexual activity: Not Currently    Birth control/protection: Condom  Other Topics Concern   Not on file  Social History Narrative   Not on file   Social Determinants of Health   Financial Resource Strain: Not on file  Food Insecurity: Not on file  Transportation Needs: Not on file  Physical Activity: Not on file  Stress: Not on file  Social Connections: Not on file  Intimate Partner Violence: Not on file    Outpatient Medications Prior to Visit  Medication Sig Dispense Refill   Acyclovir (SITAVIG) 50 MG TABS Apply one tab to gum line at site of fever blister and let dissolve. 2 tablet 1   cyclobenzaprine (FLEXERIL) 10 MG tablet TAKE 1 TABLET (10 MG TOTAL) BY MOUTH 3 (THREE) TIMES DAILY AS NEEDED  FOR MUSCLE SPASMS. 30 tablet 1   valACYclovir (VALTREX) 1000 MG tablet TAKE TWO TABLETS BY MOUTH TWICE DAILY (Patient taking differently: Take 1,000 mg by mouth as needed.) 12 tablet 0   benzonatate (TESSALON) 100 MG capsule Take 1 capsule (100 mg total) by mouth 3 (three) times daily as needed for cough. (Patient not taking: Reported on 02/17/2021) 30 capsule 0   doxycycline (VIBRA-TABS) 100 MG tablet Take 1 tablet (100 mg total) by mouth 2 (two) times daily. (Patient not taking: Reported on 02/17/2021) 14 tablet 0   Multiple Vitamins-Minerals (MULTIVITAMIN WITH MINERALS) tablet Take 1 tablet by mouth daily. (Patient not taking: Reported on 02/17/2021)     Vitamin D, Ergocalciferol, (DRISDOL) 1.25 MG (50000 UT) CAPS capsule Take 1 capsule (50,000 Units total) by mouth every 7 (seven) days. (Patient not taking: Reported on 02/17/2021) 16 capsule 0   No facility-administered medications prior to visit.    Allergies  Allergen Reactions   Albuterol Other (See Comments)     Headaches   Penicillins Hives and Other (See Comments)    Has patient had a PCN reaction causing immediate rash, facial/tongue/throat swelling, SOB or lightheadedness with hypotension: Yes Has patient had a PCN reaction causing severe rash involving mucus membranes or skin necrosis: No Has patient had a PCN reaction that required hospitalization No Has patient had a PCN reaction occurring within the last 10 years: No If all of the above answers are "NO", then may proceed with Cephalosporin use.     Review of Systems  Musculoskeletal:  Positive for back pain.  A fourteen system review of systems was performed and found to be positive as per HPI.  Objective:    Physical Exam Constitutional:      Appearance: She is not ill-appearing or diaphoretic.  HENT:     Head: Normocephalic and atraumatic.  Eyes:     Extraocular Movements: Extraocular movements intact.  Pulmonary:     Effort: Pulmonary effort is normal.  Musculoskeletal:        General: Tenderness present. No deformity or signs of injury.     Cervical back: Normal range of motion and neck supple.     Lumbar back: Tenderness present. Positive right straight leg raise test.  Skin:    General: Skin is warm.  Neurological:     General: No focal deficit present.     Mental Status: She is alert.  Psychiatric:        Mood and Affect: Mood normal.        Behavior: Behavior normal.        Thought Content: Thought content normal.    BP 119/83   Pulse 72   Temp 98.8 F (37.1 C)   Ht 5\' 8"  (1.727 m)   Wt 217 lb (98.4 kg)   LMP 10/16/2010   SpO2 99%   BMI 32.99 kg/m  Wt Readings from Last 3 Encounters:  02/20/21 217 lb (98.4 kg)  06/07/20 193 lb (87.5 kg)  03/25/20 197 lb (89.4 kg)    Health Maintenance Due  Topic Date Due   Hepatitis C Screening  Never done   COLONOSCOPY (Pts 45-53yrs Insurance coverage will need to be confirmed)  05/05/2016   COVID-19 Vaccine (3 - Pfizer risk series) 03/10/2020   INFLUENZA VACCINE   Never done    There are no preventive care reminders to display for this patient.   Lab Results  Component Value Date   TSH 2.71 09/07/2018   Lab Results  Component Value  Date   WBC 6.4 01/07/2017   HGB 13.4 01/07/2017   HCT 39.2 01/07/2017   MCV 87 01/07/2017   PLT 280 01/07/2017   Lab Results  Component Value Date   NA 142 01/07/2017   K 4.6 01/07/2017   CO2 25 01/07/2017   GLUCOSE 93 01/07/2017   BUN 11 01/07/2017   CREATININE 0.88 01/07/2017   BILITOT 0.6 01/07/2017   ALKPHOS 43 01/07/2017   AST 17 01/07/2017   ALT 10 01/07/2017   PROT 7.0 01/07/2017   ALBUMIN 4.8 01/07/2017   CALCIUM 9.7 01/07/2017   ANIONGAP 7 11/07/2015   Lab Results  Component Value Date   CHOL 242 (H) 01/07/2017   Lab Results  Component Value Date   HDL 54 01/07/2017   Lab Results  Component Value Date   LDLCALC 155 (H) 01/07/2017   Lab Results  Component Value Date   TRIG 165 (H) 01/07/2017   Lab Results  Component Value Date   CHOLHDL 4.5 (H) 01/07/2017   Lab Results  Component Value Date   HGBA1C 5.3 01/07/2017       Assessment & Plan:   Problem List Items Addressed This Visit   None Visit Diagnoses     Chronic right-sided low back pain with right-sided sciatica    -  Primary      Chronic right-sided low back pain with right-sided sciatica: -Discussed with patient suspicious for lumbar radiculopathy. Patient wants to trial muscle relaxer before considering corticosteroid therapy. Advised if symptoms fail to improve or worsen to let me know and recommend solu-medrol 125 mg injection. Patent verbalized understanding. Recommend to continue with home supportive care. If symptoms fail to improve or worsen despite conservative therapy recommend obtaining imaging studies such as lumbar MRI.    No orders of the defined types were placed in this encounter.    Lorrene Reid, PA-C

## 2021-02-22 LAB — HEMOGLOBIN A1C: Hemoglobin A1C: 5

## 2021-02-22 LAB — LIPID PANEL
Cholesterol: 272 — AB (ref 0–200)
HDL: 60 (ref 35–70)
LDl/HDL Ratio: 4.5
Triglycerides: 232 — AB (ref 40–160)

## 2021-03-12 LAB — ESTRADIOL

## 2021-03-12 LAB — FSH/LH

## 2021-04-10 ENCOUNTER — Other Ambulatory Visit: Payer: Self-pay

## 2021-04-10 ENCOUNTER — Telehealth: Payer: Self-pay | Admitting: Physician Assistant

## 2021-04-10 DIAGNOSIS — R52 Pain, unspecified: Secondary | ICD-10-CM

## 2021-04-10 NOTE — Telephone Encounter (Signed)
Referral sent 

## 2021-04-10 NOTE — Telephone Encounter (Signed)
Patient is requesting that we send a new referral to Dr. Dorcas Mcmurray with [facility]. For a 2nd opinion.

## 2021-04-14 ENCOUNTER — Other Ambulatory Visit: Payer: Self-pay | Admitting: Physician Assistant

## 2021-04-14 DIAGNOSIS — R52 Pain, unspecified: Secondary | ICD-10-CM

## 2021-05-27 ENCOUNTER — Encounter: Payer: 59 | Admitting: Physician Assistant

## 2021-06-01 ENCOUNTER — Other Ambulatory Visit: Payer: Self-pay | Admitting: Physician Assistant

## 2021-06-01 DIAGNOSIS — M62830 Muscle spasm of back: Secondary | ICD-10-CM

## 2021-06-02 ENCOUNTER — Encounter: Payer: Self-pay | Admitting: Physician Assistant

## 2021-06-02 ENCOUNTER — Ambulatory Visit (INDEPENDENT_AMBULATORY_CARE_PROVIDER_SITE_OTHER): Payer: PRIVATE HEALTH INSURANCE | Admitting: Physician Assistant

## 2021-06-02 ENCOUNTER — Other Ambulatory Visit: Payer: Self-pay

## 2021-06-02 VITALS — BP 118/76 | HR 76 | Temp 98.0°F | Ht 68.0 in | Wt 220.0 lb

## 2021-06-02 DIAGNOSIS — Z130 Encounter for screening for diseases of the blood and blood-forming organs and certain disorders involving the immune mechanism: Secondary | ICD-10-CM

## 2021-06-02 DIAGNOSIS — Z1159 Encounter for screening for other viral diseases: Secondary | ICD-10-CM | POA: Diagnosis not present

## 2021-06-02 DIAGNOSIS — Z1211 Encounter for screening for malignant neoplasm of colon: Secondary | ICD-10-CM | POA: Diagnosis not present

## 2021-06-02 DIAGNOSIS — Z0000 Encounter for general adult medical examination without abnormal findings: Secondary | ICD-10-CM | POA: Diagnosis not present

## 2021-06-02 DIAGNOSIS — Z1329 Encounter for screening for other suspected endocrine disorder: Secondary | ICD-10-CM | POA: Diagnosis not present

## 2021-06-02 DIAGNOSIS — J0100 Acute maxillary sinusitis, unspecified: Secondary | ICD-10-CM

## 2021-06-02 DIAGNOSIS — Z1321 Encounter for screening for nutritional disorder: Secondary | ICD-10-CM

## 2021-06-02 DIAGNOSIS — Z13228 Encounter for screening for other metabolic disorders: Secondary | ICD-10-CM

## 2021-06-02 MED ORDER — AZITHROMYCIN 250 MG PO TABS: ORAL_TABLET | ORAL | 0 refills | Status: AC

## 2021-06-02 NOTE — Progress Notes (Signed)
Subjective:     [name] Orr is a 51 y.o. female and is here for a comprehensive physical exam. The patient reports problems - nasal congestion, sinus pressure, productive cough x 2 weeks .  Social History   Socioeconomic History   Marital status: Single    Spouse name: Not on file   Number of children: Not on file   Years of education: Not on file   Highest education level: Not on file  Occupational History   Not on file  Tobacco Use   Smoking status: Never   Smokeless tobacco: Never  Vaping Use   Vaping Use: Never used  Substance and Sexual Activity   Alcohol use: Yes    Alcohol/week: 0.0 standard drinks    Comment: 3-4 glasses of wine per month   Drug use: No   Sexual activity: Not Currently    Birth control/protection: Condom  Other Topics Concern   Not on file  Social History Narrative   Not on file   Social Determinants of Health   Financial Resource Strain: Not on file  Food Insecurity: Not on file  Transportation Needs: Not on file  Physical Activity: Not on file  Stress: Not on file  Social Connections: Not on file  Intimate Partner Violence: Not on file   Health Maintenance  Topic Date Due   Pneumococcal Vaccine 8-57 Years old (1 - PCV) Never done   Hepatitis C Screening  Never done   Zoster Vaccines- Shingrix (1 of 2) Never done   COLONOSCOPY (Pts 45-51yrs Insurance coverage will need to be confirmed)  05/05/2016   COVID-19 Vaccine (3 - Pfizer risk series) 03/10/2020   INFLUENZA VACCINE  Never done   MAMMOGRAM  05/05/2021   TETANUS/TDAP  04/23/2027   HIV Screening  Completed   HPV VACCINES  Aged Out   PAP SMEAR-Modifier  Discontinued    The following portions of the patient's history were reviewed and updated as appropriate: allergies, current medications, past family history, past medical history, past social history, past surgical history, and problem list.  Review of Systems Pertinent items noted in HPI and remainder of comprehensive  ROS otherwise negative.   Objective:    BP 118/76    Pulse 76    Temp 98 F (36.7 C)    Ht 5\' 8"  (1.727 m)    Wt 220 lb (99.8 kg)    LMP 10/16/2010    SpO2 96%    BMI 33.45 kg/m  General appearance: alert, cooperative, and no distress Head: Normocephalic, without obvious abnormality, atraumatic Eyes: conjunctivae/corneas clear. PERRL, EOM's intact. Fundi benign. Ears: abnormal TM right ear - serous middle ear fluid and abnormal TM left ear - serous middle ear fluid Nose: turbinates swollen, inflamed, sinus tenderness bilateral, deviated septum Throat: lips, mucosa, and tongue normal; teeth and gums normal Neck: no adenopathy, no JVD, supple, symmetrical, trachea midline, and thyroid: normal to inspection and palpation Back: symmetric, no curvature. ROM normal. No CVA tenderness., healed lumbar surgical scar  Lungs: rhonchi bilaterally and no wheezing, crackles or rales. Heart: regular rate and rhythm, S1, S2 normal, no murmur, click, rub or gallop Abdomen: soft, non-tender; bowel sounds normal; no masses,  no organomegaly Extremities: extremities normal, atraumatic, no cyanosis or edema Pulses: 2+ and symmetric Skin: Skin color, texture, turgor normal. No rashes or lesions Lymph nodes: Cervical adenopathy: normal and Supraclavicular adenopathy: normal Neurologic: Grossly normal    Assessment:    Healthy female exam.     Plan:  -Will  obtain routine fasting labs. Reviewed with patient biometric screening labs from a few months ago which revealed elevated triglycerides. Discussed a heart healthy diet low in fat and carbohydrates. Discussed increasing physical activity as tolerated (has chronic back pain). -Pt deferred Shingrix. Agreeable to Hep C screening and colonoscopy.  -Will request OB/GYN records for female exam and mammogram.  -Patient has s/sx consistent with sinusitis so will start oral antibiotic therapy. Recommend to continue home supportive care with decongestant.  -Continue  weight loss efforts with dietary and lifestyle changes. Pt states was started on weight loss medication (Saxenda) by her OB/GYN and did not tolerate well due to headache, discussed to consider Wegovy. States still has Buyer, retail rx at the pharamcy previously given by her OB/GYN but med out of stock at that time. -Follow up in 1 year for CPE and FBW  See After Visit Summary for Counseling Recommendations

## 2021-06-02 NOTE — Patient Instructions (Signed)

## 2021-06-03 LAB — CBC WITH DIFFERENTIAL/PLATELET
Basophils Absolute: 0 10*3/uL (ref 0.0–0.2)
Basos: 1 %
EOS (ABSOLUTE): 0.1 10*3/uL (ref 0.0–0.4)
Eos: 3 %
Hematocrit: 39.6 % (ref 34.0–46.6)
Hemoglobin: 13.7 g/dL (ref 11.1–15.9)
Immature Grans (Abs): 0 10*3/uL (ref 0.0–0.1)
Immature Granulocytes: 0 %
Lymphocytes Absolute: 1.6 10*3/uL (ref 0.7–3.1)
Lymphs: 34 %
MCH: 29.5 pg (ref 26.6–33.0)
MCHC: 34.6 g/dL (ref 31.5–35.7)
MCV: 85 fL (ref 79–97)
Monocytes Absolute: 0.3 10*3/uL (ref 0.1–0.9)
Monocytes: 6 %
Neutrophils Absolute: 2.6 10*3/uL (ref 1.4–7.0)
Neutrophils: 56 %
Platelets: 303 10*3/uL (ref 150–450)
RBC: 4.64 x10E6/uL (ref 3.77–5.28)
RDW: 13 % (ref 11.7–15.4)
WBC: 4.6 10*3/uL (ref 3.4–10.8)

## 2021-06-03 LAB — TSH: TSH: 1.38 u[IU]/mL (ref 0.450–4.500)

## 2021-06-03 LAB — COMPREHENSIVE METABOLIC PANEL
ALT: 19 IU/L (ref 0–32)
AST: 28 IU/L (ref 0–40)
Albumin/Globulin Ratio: 2 (ref 1.2–2.2)
Albumin: 4.5 g/dL (ref 3.8–4.8)
Alkaline Phosphatase: 70 IU/L (ref 44–121)
BUN/Creatinine Ratio: 16 (ref 9–23)
BUN: 13 mg/dL (ref 6–24)
Bilirubin Total: 0.6 mg/dL (ref 0.0–1.2)
CO2: 20 mmol/L (ref 20–29)
Calcium: 9.7 mg/dL (ref 8.7–10.2)
Chloride: 103 mmol/L (ref 96–106)
Creatinine, Ser: 0.79 mg/dL (ref 0.57–1.00)
Globulin, Total: 2.2 g/dL (ref 1.5–4.5)
Glucose: 110 mg/dL — ABNORMAL HIGH (ref 70–99)
Potassium: 4.3 mmol/L (ref 3.5–5.2)
Sodium: 138 mmol/L (ref 134–144)
Total Protein: 6.7 g/dL (ref 6.0–8.5)
eGFR: 91 mL/min/{1.73_m2} (ref >59)

## 2021-06-03 LAB — HEPATITIS C ANTIBODY: Hep C Virus Ab: 0.2 s/co ratio (ref 0.0–0.9)

## 2021-06-04 LAB — LIPID PANEL W/O CHOL/HDL RATIO
Cholesterol, Total: 270 mg/dL — ABNORMAL HIGH (ref 100–199)
HDL: 43 mg/dL (ref >39)
LDL Chol Calc (NIH): 173 mg/dL — ABNORMAL HIGH (ref 0–99)
Triglycerides: 283 mg/dL — ABNORMAL HIGH (ref 0–149)
VLDL Cholesterol Cal: 54 mg/dL — ABNORMAL HIGH (ref 5–40)

## 2021-06-04 LAB — SPECIMEN STATUS REPORT

## 2021-06-05 ENCOUNTER — Encounter: Payer: Self-pay | Admitting: Physician Assistant

## 2021-08-18 ENCOUNTER — Ambulatory Visit: Payer: 59 | Admitting: Dermatology

## 2021-08-19 ENCOUNTER — Encounter: Payer: Self-pay | Admitting: Gastroenterology

## 2021-08-22 ENCOUNTER — Ambulatory Visit: Payer: PRIVATE HEALTH INSURANCE | Admitting: Gastroenterology

## 2021-08-22 ENCOUNTER — Encounter: Payer: Self-pay | Admitting: Gastroenterology

## 2021-09-12 ENCOUNTER — Ambulatory Visit (INDEPENDENT_AMBULATORY_CARE_PROVIDER_SITE_OTHER): Payer: PRIVATE HEALTH INSURANCE | Admitting: Gastroenterology

## 2021-09-12 ENCOUNTER — Encounter: Payer: Self-pay | Admitting: Gastroenterology

## 2021-09-12 VITALS — BP 100/72 | HR 79 | Ht 67.5 in | Wt 197.5 lb

## 2021-09-12 DIAGNOSIS — K5909 Other constipation: Secondary | ICD-10-CM | POA: Diagnosis not present

## 2021-09-12 DIAGNOSIS — K625 Hemorrhage of anus and rectum: Secondary | ICD-10-CM | POA: Diagnosis not present

## 2021-09-12 MED ORDER — PEG 3350-KCL-NA BICARB-NACL 420 G PO SOLR: 4000.0000 mL | Freq: Once | ORAL | 0 refills | Status: AC

## 2021-09-12 MED ORDER — LINACLOTIDE 290 MCG PO CAPS: 290.0000 ug | ORAL_CAPSULE | Freq: Every day | ORAL | 0 refills | Status: DC

## 2021-09-12 MED ORDER — METOCLOPRAMIDE HCL 5 MG PO TABS: ORAL_TABLET | ORAL | 0 refills | Status: DC

## 2021-09-12 MED ORDER — LINACLOTIDE 145 MCG PO CAPS: 145.0000 ug | ORAL_CAPSULE | Freq: Every day | ORAL | 0 refills | Status: DC

## 2021-09-12 NOTE — Progress Notes (Signed)
? ? ?[facility] Consult Note: ? ?History: ?[name] Orr ?09/12/2021 ? ?Referring provider: Lorrene Reid, PA-C ? ?Reason for consult/chief complaint: Rectal Bleeding (BRB on the paper and in the toilet with clots 1 month ago lasting for 1 month), Constipation (BM once a wk normally hard balls, pt taking Wegovy), and Abdominal Pain (Intermittent Generalized gas pains,) ? ? ?Subjective  ?HPI: ? ?[name] Orr was here for evaluation of longstanding constipation and recent rectal bleeding.  Her constipation has been a lifelong, she has used various OTC regimens with variable improvement.  Lately she has been taking a magnesium supplement about twice a week that helps to give some relief.  She had rectal bleeding with clots that lasted about a month and stopped about a month ago.  She noticed a worsening of the constipation from a BM twice a week to now just once a week since starting a GLP-1 agonist for weight loss.  She wants to continue it for a while because she has lost 23 pounds and hopes to lose about 20 more. ?She recalls a colonoscopy maybe 10 years ago by Dr. Christopher Martinique in the [place], [place] area, believes it was normal.  She had some concerns because her father apparently died of a bowel obstruction. ? ?[name] Orr does not recall any prescription medicines being used over the years such as Zelnorm, Linzess, Amitiza or Motegrity. ?She has sometimes applied perineal pressure to help aid with a bowel movement, no disimpaction ? ?There is a colonoscopy report on file by Dr. Sharlett Iles in 1996 indicating chronic constipation with abdominal pain and rectal bleeding.  His impression was functional constipation, perhaps exacerbated by opioid therapy after back surgery. ?ROS: ? ?Review of Systems  ?Constitutional:  Negative for appetite change and unexpected weight change.  ?HENT:  Negative for mouth sores and voice change.   ?Eyes:  Negative for pain and redness.  ?Respiratory:   Negative for cough and shortness of breath.   ?Cardiovascular:  Negative for chest pain and palpitations.  ?Genitourinary:  Negative for dysuria and hematuria.  ?Musculoskeletal:  Positive for back pain. Negative for arthralgias and myalgias.  ?Skin:  Negative for pallor and rash.  ?Neurological:  Negative for weakness and headaches.  ?Hematological:  Negative for adenopathy.  ? ? ?Past Medical History: ?Past Medical History:  ?Diagnosis Date  ? Actinic keratosis   ? Anemia   ? history  ? Anxiety   ? no meds  ? Blood in stool   ? bright red each time has BM for atleast 6 mths  ? Chronic back pain greater than 3 months duration   ? Depression   ? Endometriosis   ? Fainting spell   ? GERD (gastroesophageal reflux disease)   ? Headache(784.0)   ? History of chicken pox   ? Kidney stones   ? Migraine   ? Renal disorder   ? kidney stones  ? ? ? ?Past Surgical History: ?Past Surgical History:  ?Procedure Laterality Date  ? ABDOMINAL HYSTERECTOMY    ? colonscopy    ? DIAGNOSTIC LAPAROSCOPY    ? x 2  ? DILATION AND CURETTAGE OF UTERUS    ? LUMBAR FUSION  [phone]  ? Dr Glenna Fellows  ? PATELLA REALIGNMENT Left 2009  ? lateral realignment to lt knee  ? VARICOSE VEIN SURGERY Left   ? ?Laparoscopies were for endometriosis before pregnancies ? ?Family History: ?Family History  ?Problem Relation Age of Onset  ? Hyperlipidemia Mother   ? Hypertension Mother   ?  COPD Mother   ? Alcohol abuse Father   ? Heart disease Father   ? Stroke Father   ? Kidney disease Father   ? Other Father   ?     deceased from bowel blockage  ? COPD Maternal Grandfather   ? ? ?Social History: ?Social History  ? ?Socioeconomic History  ? Marital status: Single  ?  Spouse name: Not on file  ? Number of children: 1  ? Years of education: Not on file  ? Highest education level: Not on file  ?Occupational History  ? Occupation: cosultant  ?Tobacco Use  ? Smoking status: Never  ? Smokeless tobacco: Never  ?Vaping Use  ? Vaping Use: Never used  ?Substance and  Sexual Activity  ? Alcohol use: Yes  ?  Comment: 2-3 a wk  ? Drug use: No  ? Sexual activity: Not Currently  ?  Birth control/protection: Condom  ?Other Topics Concern  ? Not on file  ?Social History Narrative  ? Not on file  ? ?Social Determinants of Health  ? ?Financial Resource Strain: Not on file  ?Food Insecurity: Not on file  ?Transportation Needs: Not on file  ?Physical Activity: Not on file  ?Stress: Not on file  ?Social Connections: Not on file  ? ? ?Allergies: ?Allergies  ?Allergen Reactions  ? Albuterol Other (See Comments)  ?  Headaches  ? Penicillins Hives and Other (See Comments)  ?  Has patient had a PCN reaction causing immediate rash, facial/tongue/throat swelling, SOB or lightheadedness with hypotension: Yes ?Has patient had a PCN reaction causing severe rash involving mucus membranes or skin necrosis: No ?Has patient had a PCN reaction that required hospitalization No ?Has patient had a PCN reaction occurring within the last 10 years: No ?If all of the above answers are "NO", then may proceed with Cephalosporin use. ?  ? ? ?Outpatient Meds: ?Current Outpatient Medications  ?Medication Sig Dispense Refill  ? cyclobenzaprine (FLEXERIL) 10 MG tablet TAKE 1 TABLET BY MOUTH THREE TIMES A DAY AS NEEDED FOR MUSCLE SPASMS 30 tablet 1  ? estradiol (VIVELLE-DOT) 0.05 MG/24HR patch Place 1 patch onto the skin 2 (two) times a week.    ? linaclotide (LINZESS) 145 MCG CAPS capsule Take 1 capsule (145 mcg total) by mouth daily before breakfast. 8 capsule 0  ? linaclotide (LINZESS) 290 MCG CAPS capsule Take 1 capsule (290 mcg total) by mouth daily before breakfast. 8 capsule 0  ? metoCLOPramide (REGLAN) 5 MG tablet Take 1 tablet 30-60 minutes prior to colonoscopy prep 2 tablet 0  ? polyethylene glycol-electrolytes (NULYTELY) 420 g solution Take 4,000 mLs by mouth once for 1 dose. 4000 mL 0  ? valACYclovir (VALTREX) 1000 MG tablet TAKE TWO TABLETS BY MOUTH TWICE DAILY (Patient taking differently: Take 1,000 mg by  mouth as needed.) 12 tablet 0  ? WEGOVY 1 MG/0.5ML SOAJ Inject 1 mg into the skin once a week.    ? ?No current facility-administered medications for this visit.  ? ? ? ? ?___________________________________________________________________ ?Objective  ? ?Exam: ? ?BP 100/72 (BP Location: Left Arm, Patient Position: Sitting, Cuff Size: Normal)   Pulse 79   Ht 5' 7.5" (1.715 m) Comment: measure without shoes  Wt 197 lb 8 oz (89.6 kg)   LMP 10/16/2010   BMI 30.48 kg/m?  ?Wt Readings from Last 3 Encounters:  ?09/12/21 197 lb 8 oz (89.6 kg)  ?06/02/21 220 lb (99.8 kg)  ?02/20/21 217 lb (98.4 kg)  ? ? ?General: She is  well-appearing and pleasant ?Eyes: sclera anicteric, no redness ?ENT: oral mucosa moist without lesions, no cervical or supraclavicular lymphadenopathy ?CV: RRR without murmur, S1/S2, no JVD, no peripheral edema ?Resp: clear to auscultation bilaterally, normal RR and effort noted ?GI: soft, no tenderness, with active bowel sounds. No guarding or palpable organomegaly noted. ?Skin; warm and dry, no rash or jaundice noted ?Neuro: awake, alert and oriented x 3. Normal gross motor function and fluent speech ?Rectal: Normal perianal exam.  Normal resting tone and voluntary sphincter and puborectalis contraction.  Normal rectal descent and abdominal contraction bearing down. ?No fissure or palpable internal lesion.  Firm stool rectal vault. ?Labs: ? ? ?  Latest Ref Rng & Units 06/02/2021  ? 11:54 AM 01/07/2017  ?  3:24 PM 11/07/2015  ? 11:21 PM  ?CBC  ?WBC 3.4 - 10.8 x10E3/uL 4.6   6.4   8.5    ?Hemoglobin 11.1 - 15.9 g/dL 13.7   13.4   13.6    ?Hematocrit 34.0 - 46.6 % 39.6   39.2   38.6    ?Platelets 150 - 450 x10E3/uL 303   280   301    ? ? ?  Latest Ref Rng & Units 06/02/2021  ? 11:54 AM 01/07/2017  ?  3:24 PM 11/12/2015  ?  4:36 PM  ?CMP  ?Glucose 70 - 99 mg/dL 110   93     ?BUN 6 - 24 mg/dL 13   11     ?Creatinine 0.57 - 1.00 mg/dL 0.79   0.88     ?Sodium 134 - 144 mmol/L 138   142     ?Potassium 3.5 - 5.2  mmol/L 4.3   4.6     ?Chloride 96 - 106 mmol/L 103   103     ?CO2 20 - 29 mmol/L 20   25     ?Calcium 8.7 - 10.2 mg/dL 9.7   9.7   9.8    ?Total Protein 6.0 - 8.5 g/dL 6.7   7.0     ?Total Bilirubin 0.0 - 1.2 mg/dL 0

## 2021-09-12 NOTE — Patient Instructions (Addendum)
If you are age 51 or older, your body mass index should be between 23-30. Your Body mass index is 30.48 kg/m?Marland Kitchen If this is out of the aforementioned range listed, please consider follow up with your Primary Care Provider. ? ?If you are age 14 or younger, your body mass index should be between 19-25. Your Body mass index is 30.48 kg/m?Marland Kitchen If this is out of the aformentioned range listed, please consider follow up with your Primary Care Provider.  ? ?________________________________________________________ ? ?The [facility] providers would like to encourage you to use [facility] to communicate with providers for non-urgent requests or questions.  Due to long hold times on the telephone, sending your provider a message by [facility] may be a faster and more efficient way to get a response.  Please allow 48 business hours for a response.  Please remember that this is for non-urgent requests.  ?_______________________________________________________ ? ?You have been scheduled for a colonoscopy. Please follow written instructions given to you at your visit today.  ?Please pick up your prep supplies at the pharmacy within the next 1-3 days. ?If you use inhalers (even only as needed), please bring them with you on the day of your procedure. ? ?Samples were given today for Linzess 145 mcg and 29 mcg.please let us know which one you like and we can send a prescription to the pharmacy for you.  ? ?Please use Miralax 1 capful twice a day for 3 days prior to the colonoscopy ? ?It was a pleasure to see you today! ? ?Thank you for trusting me with your gastrointestinal care!   ?  ?

## 2021-10-07 ENCOUNTER — Other Ambulatory Visit: Payer: Self-pay | Admitting: Physician Assistant

## 2021-10-07 DIAGNOSIS — M62830 Muscle spasm of back: Secondary | ICD-10-CM

## 2021-10-24 ENCOUNTER — Encounter: Payer: PRIVATE HEALTH INSURANCE | Admitting: Gastroenterology

## 2021-11-06 ENCOUNTER — Encounter: Payer: Self-pay | Admitting: Gastroenterology

## 2021-11-10 ENCOUNTER — Encounter: Payer: PRIVATE HEALTH INSURANCE | Admitting: Gastroenterology

## 2021-11-13 ENCOUNTER — Encounter: Payer: PRIVATE HEALTH INSURANCE | Admitting: Gastroenterology

## 2021-12-02 ENCOUNTER — Other Ambulatory Visit: Payer: PRIVATE HEALTH INSURANCE

## 2021-12-02 DIAGNOSIS — E7889 Other lipoprotein metabolism disorders: Secondary | ICD-10-CM

## 2021-12-03 ENCOUNTER — Encounter: Payer: Self-pay | Admitting: Physician Assistant

## 2021-12-03 LAB — LIPID PANEL
Chol/HDL Ratio: 6 ratio — ABNORMAL HIGH (ref 0.0–4.4)
Cholesterol, Total: 241 mg/dL — ABNORMAL HIGH (ref 100–199)
HDL: 40 mg/dL (ref >39)
LDL Chol Calc (NIH): 148 mg/dL — ABNORMAL HIGH (ref 0–99)
Triglycerides: 286 mg/dL — ABNORMAL HIGH (ref 0–149)
VLDL Cholesterol Cal: 53 mg/dL — ABNORMAL HIGH (ref 5–40)

## 2021-12-05 ENCOUNTER — Other Ambulatory Visit: Payer: PRIVATE HEALTH INSURANCE

## 2022-01-08 ENCOUNTER — Telehealth: Payer: Self-pay | Admitting: Gastroenterology

## 2022-01-08 NOTE — Telephone Encounter (Signed)
PT has a rescheduled date for colonoscopy 8/23 and the instructions don't coincide with the date. Wants to have it sent to mychart.

## 2022-01-08 NOTE — Telephone Encounter (Signed)
Spoke with pt; new instructions sent via [facility] 

## 2022-01-12 ENCOUNTER — Encounter: Payer: Self-pay | Admitting: Gastroenterology

## 2022-01-12 DIAGNOSIS — K625 Hemorrhage of anus and rectum: Secondary | ICD-10-CM

## 2022-01-12 MED ORDER — NA SULFATE-K SULFATE-MG SULF 17.5-3.13-1.6 GM/177ML PO SOLN: 1.0000 | Freq: Once | ORAL | 0 refills | Status: AC

## 2022-01-14 ENCOUNTER — Encounter: Payer: Self-pay | Admitting: Gastroenterology

## 2022-01-14 ENCOUNTER — Ambulatory Visit (AMBULATORY_SURGERY_CENTER): Payer: PRIVATE HEALTH INSURANCE | Admitting: Gastroenterology

## 2022-01-14 VITALS — BP 111/79 | HR 73 | Temp 96.9°F | Resp 16 | Ht 67.0 in | Wt 197.0 lb

## 2022-01-14 DIAGNOSIS — K648 Other hemorrhoids: Secondary | ICD-10-CM | POA: Diagnosis not present

## 2022-01-14 DIAGNOSIS — K625 Hemorrhage of anus and rectum: Secondary | ICD-10-CM

## 2022-01-14 DIAGNOSIS — K5909 Other constipation: Secondary | ICD-10-CM

## 2022-01-14 MED ORDER — SODIUM CHLORIDE 0.9 % IV SOLN: 500.0000 mL | Freq: Once | INTRAVENOUS | Status: DC

## 2022-01-14 NOTE — Op Note (Signed)
[facility] Patient Name: [name] Orr Procedure Date: 01/14/2022 9:14 AM MRN: [id number] Endoscopist: Mallie Mussel L. Loletha Carrow , MD Age: 51 Referring MD:  Date of Birth: 1970-12-23 Gender: Female Account #: [id number] Procedure:                Colonoscopy Indications:              Rectal bleeding, Constipation                           (both reportedly improved in recent months) Medicines:                Monitored Anesthesia Care Procedure:                Pre-Anesthesia Assessment:                           - Prior to the procedure, a History and Physical                            was performed, and patient medications and                            allergies were reviewed. The patient's tolerance of                            previous anesthesia was also reviewed. The risks                            and benefits of the procedure and the sedation                            options and risks were discussed with the patient.                            All questions were answered, and informed consent                            was obtained. Prior Anticoagulants: The patient has                            taken no previous anticoagulant or antiplatelet                            agents. ASA Grade Assessment: II - A patient with                            mild systemic disease. After reviewing the risks                            and benefits, the patient was deemed in                            satisfactory condition to undergo the procedure.  After obtaining informed consent, the colonoscope                            was passed under direct vision. Throughout the                            procedure, the patient's blood pressure, pulse, and                            oxygen saturations were monitored continuously. The                            Olympus CF-HQ190L [id number]) Colonoscope was                            introduced through the anus and  advanced to the the                            cecum, identified by appendiceal orifice and                            ileocecal valve. The colonoscopy was performed                            without difficulty. The patient tolerated the                            procedure well. The quality of the bowel                            preparation was poor. The ileocecal valve,                            appendiceal orifice, and rectum were photographed.                            The bowel preparation used was SUPREP. Scope In: 9:40:03 AM Scope Out: 9:55:14 AM Scope Withdrawal Time: 0 hours 10 minutes 22 seconds  Total Procedure Duration: 0 hours 15 minutes 11 seconds  Findings:                 The perianal and digital rectal examinations were                            normal.                           Opaque, semi-liquid stool was found in the entire                            colon, interfering with visualization. Lavage of                            the area was performed using a large amount,  resulting in incomplete clearance with fair                            visualization.                           Internal hemorrhoids were found.                           The exam was otherwise without abnormality on                            direct and retroflexion views. Complications:            No immediate complications. Estimated Blood Loss:     Estimated blood loss: none. Impression:               - Preparation of the colon was poor.                           - Stool in the entire examined colon.                           - Internal hemorrhoids.                           - The examination was otherwise normal on direct                            and retroflexion views.                           - No specimens collected. Recommendation:           - Patient has a contact number available for                            emergencies. The signs and symptoms of  potential                            delayed complications were discussed with the                            patient. Return to normal activities tomorrow.                            Written discharge instructions were provided to the                            patient.                           - Resume previous diet.                           - Continue present medications.                           -  Repeat colonoscopy in 1 year for screening                            purposes. (split-dose golytely prep for next exam) Jamorion Gomillion L. Loletha Carrow, MD 01/14/2022 10:00:50 AM This report has been signed electronically.

## 2022-01-14 NOTE — Progress Notes (Signed)
History and Physical:  This patient presents for endoscopic testing for: Encounter Diagnoses  Name Primary?   Rectal bleeding Yes   Chronic constipation     51 year old woman last seen by me in the office 09/12/2021.  She has chronic constipation and episodic rectal bleeding. She reports that constipation has significantly decreased in the several months since I last saw her.  Patient is otherwise without complaints or active issues today.   Past Medical History: Past Medical History:  Diagnosis Date   Actinic keratosis    Anemia    history   Anxiety    no meds   Blood in stool    bright red each time has BM for atleast 6 mths   Chronic back pain greater than 3 months duration    Depression    Endometriosis    Fainting spell    GERD (gastroesophageal reflux disease)    Headache(784.0)    History of chicken pox    Kidney stones    Migraine    Renal disorder    kidney stones     Past Surgical History: Past Surgical History:  Procedure Laterality Date   ABDOMINAL HYSTERECTOMY     colonscopy     DIAGNOSTIC LAPAROSCOPY     x 2   DILATION AND CURETTAGE OF UTERUS     LUMBAR FUSION  [phone]   Dr Glenna Fellows   PATELLA REALIGNMENT Left 2009   lateral realignment to lt knee   VARICOSE VEIN SURGERY Left     Allergies: Allergies  Allergen Reactions   Albuterol Other (See Comments)    Headaches   Penicillins Hives and Other (See Comments)    Has patient had a PCN reaction causing immediate rash, facial/tongue/throat swelling, SOB or lightheadedness with hypotension: Yes Has patient had a PCN reaction causing severe rash involving mucus membranes or skin necrosis: No Has patient had a PCN reaction that required hospitalization No Has patient had a PCN reaction occurring within the last 10 years: No If all of the above answers are "NO", then may proceed with Cephalosporin use.     Outpatient Meds: Current Outpatient Medications  Medication Sig Dispense Refill    cyclobenzaprine (FLEXERIL) 10 MG tablet TAKE 1 TABLET BY MOUTH THREE TIMES A DAY AS NEEDED FOR MUSCLE SPASMS 30 tablet 1   estradiol (VIVELLE-DOT) 0.05 MG/24HR patch Place 1 patch onto the skin 2 (two) times a week.     metoCLOPramide (REGLAN) 5 MG tablet Take 1 tablet 30-60 minutes prior to colonoscopy prep 2 tablet 0   linaclotide (LINZESS) 145 MCG CAPS capsule Take 1 capsule (145 mcg total) by mouth daily before breakfast. 8 capsule 0   linaclotide (LINZESS) 290 MCG CAPS capsule Take 1 capsule (290 mcg total) by mouth daily before breakfast. 8 capsule 0   valACYclovir (VALTREX) 1000 MG tablet TAKE TWO TABLETS BY MOUTH TWICE DAILY (Patient taking differently: Take 1,000 mg by mouth as needed.) 12 tablet 0   WEGOVY 1 MG/0.5ML SOAJ Inject 1 mg into the skin once a week.     Current Facility-Administered Medications  Medication Dose Route Frequency Provider Last Rate Last Admin   0.9 %  sodium chloride infusion  500 mL Intravenous Once Nelida Meuse III, MD          ___________________________________________________________________ Objective   Exam:  BP 122/89   Pulse 76   Temp (!) 96.9 F (36.1 C) (Skin)   Ht '5\' 7"'$  (1.702 m)   Wt 197 lb (89.4 kg)  LMP 10/16/2010   SpO2 100%   BMI 30.85 kg/m   CV: RRR without murmur, S1/S2 Resp: clear to auscultation bilaterally, normal RR and effort noted GI: soft, no tenderness, with active bowel sounds.   Assessment: Encounter Diagnoses  Name Primary?   Rectal bleeding Yes   Chronic constipation      Plan: Colonoscopy  The benefits and risks of the planned procedure were described in detail with the patient or (when appropriate) their health care proxy.  Risks were outlined as including, but not limited to, bleeding, infection, perforation, adverse medication reaction leading to cardiac or pulmonary decompensation, pancreatitis (if ERCP).  The limitation of incomplete mucosal visualization was also discussed.  No guarantees or  warranties were given.    The patient is appropriate for an endoscopic procedure in the ambulatory setting.   - Wilfrid Lund, MD

## 2022-01-14 NOTE — Patient Instructions (Addendum)
Read all of the handouts given to you by your recovery room nurse.   You will need a repeat colonoscopy in 1 year.  Call the office in April of 2024.  YOU HAD AN ENDOSCOPIC PROCEDURE TODAY AT [facility]:   Refer to the procedure report that was given to you for any specific questions about what was found during the examination.  If the procedure report does not answer your questions, please call your gastroenterologist to clarify.  If you requested that your care partner not be given the details of your procedure findings, then the procedure report has been included in a sealed envelope for you to review at your convenience later.  YOU SHOULD EXPECT: Some feelings of bloating in the abdomen. Passage of more gas than usual.  Walking can help get rid of the air that was put into your GI tract during the procedure and reduce the bloating. If you had a lower endoscopy (such as a colonoscopy or flexible sigmoidoscopy) you may notice spotting of blood in your stool or on the toilet paper. If you underwent a bowel prep for your procedure, you may not have a normal bowel movement for a few days.  Please Note:  You might notice some irritation and congestion in your nose or some drainage.  This is from the oxygen used during your procedure.  There is no need for concern and it should clear up in a day or so.  SYMPTOMS TO REPORT IMMEDIATELY:  Following lower endoscopy (colonoscopy or flexible sigmoidoscopy):  Excessive amounts of blood in the stool  Significant tenderness or worsening of abdominal pains  Swelling of the abdomen that is new, acute  Fever of 100F or higher  For urgent or emergent issues, a gastroenterologist can be reached at any hour by calling [phone]. Do not use MyChart messaging for urgent concerns.    DIET:  We do recommend a small meal at first, but then you may proceed to your regular diet.  Drink plenty of fluids but you should avoid alcoholic beverages for  24 hours.  Try to increase the fiber in your diet, and drink plenty of water.  ACTIVITY:  You should plan to take it easy for the rest of today and you should NOT DRIVE or use heavy machinery until tomorrow (because of the sedation medicines used during the test).    FOLLOW UP: Our staff will call the number listed on your records the next business day following your procedure.  We will call around 7:15- 8:00 am to check on you and address any questions or concerns that you may have regarding the information given to you following your procedure. If we do not reach you, we will leave a message.  If you develop any symptoms (ie: fever, flu-like symptoms, shortness of breath, cough etc.) before then, please call [phone].  If you test positive for Covid 19 in the 2 weeks post procedure, please call and report this information to [place].    If any biopsies were taken you will be contacted by phone or by letter within the next 1-3 weeks.  Please call us at [phone] if you have not heard about the biopsies in 3 weeks.    SIGNATURES/CONFIDENTIALITY: You and/or your care partner have signed paperwork which will be entered into your electronic medical record.  These signatures attest to the fact that that the information above on your After Visit Summary has been reviewed and is understood.  Full responsibility  of the confidentiality of this discharge information lies with you and/or your care-partner.

## 2022-01-14 NOTE — Progress Notes (Signed)
VSS, transported to PACU °

## 2022-01-14 NOTE — Progress Notes (Signed)
VS by Vicco. 

## 2022-01-15 ENCOUNTER — Telehealth: Payer: Self-pay | Admitting: *Deleted

## 2022-01-15 NOTE — Telephone Encounter (Signed)
  Follow up Call-     01/14/2022    8:42 AM  Call back number  Post procedure Call Back phone  # [phone]  Permission to leave phone message Yes     Patient questions:   Message left to call us if necessary.

## 2022-10-21 ENCOUNTER — Encounter: Payer: Self-pay | Admitting: Gastroenterology

## 2022-12-29 ENCOUNTER — Other Ambulatory Visit: Payer: Self-pay | Admitting: Family Medicine

## 2022-12-29 DIAGNOSIS — Z0000 Encounter for general adult medical examination without abnormal findings: Secondary | ICD-10-CM

## 2023-01-08 ENCOUNTER — Other Ambulatory Visit: Payer: PRIVATE HEALTH INSURANCE

## 2023-01-08 DIAGNOSIS — Z0000 Encounter for general adult medical examination without abnormal findings: Secondary | ICD-10-CM

## 2023-01-09 LAB — LIPID PANEL
Chol/HDL Ratio: 5 ratio — ABNORMAL HIGH (ref 0.0–4.4)
Cholesterol, Total: 268 mg/dL — ABNORMAL HIGH (ref 100–199)
HDL: 54 mg/dL (ref >39)
LDL Chol Calc (NIH): 183 mg/dL — ABNORMAL HIGH (ref 0–99)
Triglycerides: 167 mg/dL — ABNORMAL HIGH (ref 0–149)
VLDL Cholesterol Cal: 31 mg/dL (ref 5–40)

## 2023-01-09 LAB — COMPREHENSIVE METABOLIC PANEL
ALT: 12 IU/L (ref 0–32)
AST: 18 IU/L (ref 0–40)
Albumin: 4.5 g/dL (ref 3.8–4.9)
Alkaline Phosphatase: 53 IU/L (ref 44–121)
BUN/Creatinine Ratio: 15 (ref 9–23)
BUN: 11 mg/dL (ref 6–24)
Bilirubin Total: 0.7 mg/dL (ref 0.0–1.2)
CO2: 24 mmol/L (ref 20–29)
Calcium: 10.2 mg/dL (ref 8.7–10.2)
Chloride: 102 mmol/L (ref 96–106)
Creatinine, Ser: 0.74 mg/dL (ref 0.57–1.00)
Globulin, Total: 2.5 g/dL (ref 1.5–4.5)
Glucose: 81 mg/dL (ref 70–99)
Potassium: 4.4 mmol/L (ref 3.5–5.2)
Sodium: 140 mmol/L (ref 134–144)
Total Protein: 7 g/dL (ref 6.0–8.5)
eGFR: 98 mL/min/{1.73_m2} (ref >59)

## 2023-01-09 LAB — CBC
Hematocrit: 40.8 % (ref 34.0–46.6)
Hemoglobin: 13.6 g/dL (ref 11.1–15.9)
MCH: 29.4 pg (ref 26.6–33.0)
MCHC: 33.3 g/dL (ref 31.5–35.7)
MCV: 88 fL (ref 79–97)
Platelets: 268 10*3/uL (ref 150–450)
RBC: 4.62 x10E6/uL (ref 3.77–5.28)
RDW: 13 % (ref 11.7–15.4)
WBC: 4.2 10*3/uL (ref 3.4–10.8)

## 2023-01-09 LAB — HEMOGLOBIN A1C
Est. average glucose Bld gHb Est-mCnc: 111 mg/dL
Hgb A1c MFr Bld: 5.5 % (ref 4.8–5.6)

## 2023-01-09 LAB — TSH: TSH: 2.02 u[IU]/mL (ref 0.450–4.500)

## 2023-01-12 ENCOUNTER — Encounter: Payer: Self-pay | Admitting: Family Medicine

## 2023-01-12 ENCOUNTER — Ambulatory Visit (INDEPENDENT_AMBULATORY_CARE_PROVIDER_SITE_OTHER): Payer: PRIVATE HEALTH INSURANCE | Admitting: Family Medicine

## 2023-01-12 VITALS — BP 124/86 | HR 70 | Resp 18 | Ht 67.0 in | Wt 194.0 lb

## 2023-01-12 DIAGNOSIS — M545 Low back pain, unspecified: Secondary | ICD-10-CM

## 2023-01-12 DIAGNOSIS — Z0000 Encounter for general adult medical examination without abnormal findings: Secondary | ICD-10-CM

## 2023-01-12 MED ORDER — GABAPENTIN 100 MG PO CAPS
ORAL_CAPSULE | ORAL | 1 refills | Status: DC
Start: 2023-01-12 — End: 2023-03-16

## 2023-01-12 NOTE — Patient Instructions (Addendum)
Magnesium glycinate supplement: https://a.co/d/1zt1ID3

## 2023-01-12 NOTE — Progress Notes (Signed)
Complete physical exam  Patient: [name] Orr   DOB: 02-Jun-1970   52 y.o. Female  MRN: [id number]  Subjective:    Chief Complaint  Patient presents with   Annual Exam    [name] Orr is a 52 y.o. female who presents today for a complete physical exam. She reports consuming a general diet.  She does not get much physical activity.  She generally feels well. She reports sleeping poorly. It is difficult to fall asleep and stay asleep due to back pain and restless legs. She also finds that she gets up about 6 times each night, each time trying to use the bathroom. Additionally, she has noticed episodic twitches of her head similar to what her grandmother used to experience. It does not happen often and does not produce any pain, numbness, tingling.    Most recent fall risk assessment:    06/02/2021   11:22 AM  Fall Risk   Falls in the past year? 0  Number falls in past yr: 0  Injury with Fall? 0  Risk for fall due to : No Fall Risks  Follow up Falls evaluation completed     Most recent depression and anxiety screenings:    01/12/2023   10:16 AM 06/02/2021   11:23 AM  PHQ 2/9 Scores  PHQ - 2 Score 0 0  PHQ- 9 Score 5 0      01/12/2023   10:16 AM 06/02/2021   11:23 AM 02/20/2021    9:13 AM  GAD 7 : Generalized Anxiety Score  Nervous, Anxious, on Edge 1 1 0  Control/stop worrying 0 1 0  Worry too much - different things 1 1 0  Trouble relaxing 1 1 0  Restless 2 1 3   Easily annoyed or irritable 0 1 0  Afraid - awful might happen 1 0 0  Total GAD 7 Score 6 6 3   Anxiety Difficulty Not difficult at all Not difficult at all     Patient Active Problem List   Diagnosis Date Noted   Fibromyalgia 09/06/2018   Pain in deltoid, bilateral 08/30/2018   Tinnitus of both ears 06/30/2018   Migraine without status migrainosus, not intractable 05/20/2017   Body mass index (bmi) 31.0-31.9, adult 04/22/2017   Claustrophobia 04/22/2017   Lumbar back pain 01/21/2017    Chronic left shoulder pain 01/21/2017   Renal stones 09/18/2015   Intractable pain 09/13/2015   Fever blister 07/06/2013   Neck pain, bilateral 02/06/2013   Depression 10/12/2010   Migraine aura, persistent 10/12/2010   Endometriosis 10/10/2010    Past Surgical History:  Procedure Laterality Date   ABDOMINAL HYSTERECTOMY     colonscopy     DIAGNOSTIC LAPAROSCOPY     x 2   DILATION AND CURETTAGE OF UTERUS     LUMBAR FUSION  [id number]   Dr Trey Sailors   PATELLA REALIGNMENT Left 2009   lateral realignment to lt knee   VARICOSE VEIN SURGERY Left    Social History   Tobacco Use   Smoking status: Never    Passive exposure: Never   Smokeless tobacco: Never  Vaping Use   Vaping status: Never Used  Substance Use Topics   Alcohol use: Yes    Comment: 2-3 a wk   Drug use: No   Family History  Problem Relation Age of Onset   Hyperlipidemia Mother    Hypertension Mother    COPD Mother    Alcohol abuse Father    Heart disease Father  Stroke Father    Kidney disease Father    Other Father        deceased from bowel blockage   COPD Maternal Grandfather    Colitis Neg Hx    Esophageal cancer Neg Hx    Stomach cancer Neg Hx    Rectal cancer Neg Hx    Allergies  Allergen Reactions   Albuterol Other (See Comments)    Headaches   Penicillins Hives and Other (See Comments)    Has patient had a PCN reaction causing immediate rash, facial/tongue/throat swelling, SOB or lightheadedness with hypotension: Yes Has patient had a PCN reaction causing severe rash involving mucus membranes or skin necrosis: No Has patient had a PCN reaction that required hospitalization No Has patient had a PCN reaction occurring within the last 10 years: No If all of the above answers are "NO", then may proceed with Cephalosporin use.      Patient Care Team: Melida Quitter, PA as PCP - General (Family Medicine) Obgyn, [facility]   Outpatient Medications Prior to Visit  Medication Sig    estradiol (VIVELLE-DOT) 0.05 MG/24HR patch Place 1 patch onto the skin 2 (two) times a week.   linaclotide (LINZESS) 145 MCG CAPS capsule Take 1 capsule (145 mcg total) by mouth daily before breakfast.   linaclotide (LINZESS) 290 MCG CAPS capsule Take 1 capsule (290 mcg total) by mouth daily before breakfast.   metoCLOPramide (REGLAN) 5 MG tablet Take 1 tablet 30-60 minutes prior to colonoscopy prep   valACYclovir (VALTREX) 1000 MG tablet TAKE TWO TABLETS BY MOUTH TWICE DAILY (Patient taking differently: Take 1,000 mg by mouth as needed.)   WEGOVY 1 MG/0.5ML SOAJ Inject 1 mg into the skin once a week.   [DISCONTINUED] cyclobenzaprine (FLEXERIL) 10 MG tablet TAKE 1 TABLET BY MOUTH THREE TIMES A DAY AS NEEDED FOR MUSCLE SPASMS   No facility-administered medications prior to visit.    Review of Systems  Constitutional:  Negative for chills, fever and malaise/fatigue.  HENT:  Negative for congestion and hearing loss.   Eyes:  Negative for blurred vision and double vision.  Respiratory:  Negative for cough and shortness of breath.   Cardiovascular:  Negative for chest pain, palpitations and leg swelling.  Gastrointestinal:  Negative for abdominal pain, constipation, diarrhea and heartburn.  Genitourinary:  Negative for frequency and urgency.  Musculoskeletal:  Negative for myalgias and neck pain.  Neurological:  Negative for headaches.  Endo/Heme/Allergies:  Negative for polydipsia.  Psychiatric/Behavioral:  Negative for depression. The patient is not nervous/anxious and does not have insomnia.       Objective:    BP 124/86 (BP Location: Left Arm, Patient Position: Sitting, Cuff Size: Normal)   Pulse 70   Resp 18   Ht 5\' 7"  (1.702 m)   Wt 194 lb (88 kg)   LMP 10/16/2010   SpO2 98%   BMI 30.38 kg/m    Physical Exam Constitutional:      General: She is not in acute distress.    Appearance: Normal appearance.  HENT:     Head: Normocephalic and atraumatic.     Right Ear: Tympanic  membrane, ear canal and external ear normal.     Left Ear: Tympanic membrane, ear canal and external ear normal.     Nose: Nose normal.     Mouth/Throat:     Mouth: Mucous membranes are moist.     Pharynx: No oropharyngeal exudate or posterior oropharyngeal erythema.  Eyes:     Extraocular Movements:  Extraocular movements intact.     Conjunctiva/sclera: Conjunctivae normal.     Pupils: Pupils are equal, round, and reactive to light.  Neck:     Thyroid: No thyroid mass, thyromegaly or thyroid tenderness.  Cardiovascular:     Rate and Rhythm: Normal rate and regular rhythm.     Heart sounds: Normal heart sounds. No murmur heard.    No friction rub. No gallop.  Pulmonary:     Effort: Pulmonary effort is normal. No respiratory distress.     Breath sounds: Normal breath sounds. No wheezing, rhonchi or rales.  Abdominal:     General: Abdomen is flat. Bowel sounds are normal. There is no distension.     Palpations: There is no mass.     Tenderness: There is no abdominal tenderness. There is no guarding.  Musculoskeletal:        General: Normal range of motion.     Cervical back: Normal range of motion and neck supple.  Lymphadenopathy:     Cervical: No cervical adenopathy.  Skin:    General: Skin is warm and dry.  Neurological:     Mental Status: She is alert and oriented to person, place, and time.     Cranial Nerves: No cranial nerve deficit.     Motor: No weakness.     Deep Tendon Reflexes: Reflexes normal.  Psychiatric:        Mood and Affect: Mood normal.        Assessment & Plan:    Routine Health Maintenance and Physical Exam  Immunization History  Administered Date(s) Administered   PFIZER(Purple Top)SARS-COV-2 Vaccination 01/21/2020, 02/11/2020   Tdap 04/22/2017    Health Maintenance  Topic Date Due   Zoster Vaccines- Shingrix (1 of 2) Never done   PAP SMEAR-Modifier  10/22/2013   COVID-19 Vaccine (3 - Pfizer risk series) 03/10/2020   MAMMOGRAM  08/13/2022    Colonoscopy  01/15/2023   INFLUENZA VACCINE  08/23/2023 (Originally 12/24/2022)   DTaP/Tdap/Td (2 - Td or Tdap) 04/23/2027   Hepatitis C Screening  Completed   HIV Screening  Completed   HPV VACCINES  Aged Out   Reviewed most recent labs including CBC, CMP, lipid panel, A1C, TSH, and vitamin D. All within normal limits/stable from last check other than LDL increased to 183 from 696, triglycerides decreased to 167 from 286 but still elevated. Discussed increasing fiber, physical activity to improve cholesterol levels.  She sees [facility] next week and will have her mammogram and Pap smear up-to-date at that time. Discussed health benefits of physical activity, and encouraged her to engage in regular exercise appropriate for her age and condition.  Wellness examination  Lumbar back pain Assessment & Plan: Continue heating pad nightly and gentle stretches.  We discussed that previous prescription for cyclobenzaprine is not recommended for long-term use.  She is agreeable to a trial of gabapentin for lumbar back pain as well as feeling of restless legs.  We discussed potential side effects and mechanism of action, patient is agreeable to trial and will follow-up in 2 months to assess efficacy.  If changes needed, consider pregabalin or Cymbalta.  Orders: -     Gabapentin; Take 1 capsule (100 mg) by mouth at bedtime. May increase to up to 3 capsules (300 mg) at bedtime if needed.  Dispense: 90 capsule; Refill: 1    Return in about 2 months (around 03/14/2023) for follow-up for new medication (gabapentin) for back and sleep.     Melida Quitter,  PA

## 2023-01-12 NOTE — Assessment & Plan Note (Addendum)
Continue heating pad nightly and gentle stretches.  We discussed that previous prescription for cyclobenzaprine is not recommended for long-term use.  She is agreeable to a trial of gabapentin for lumbar back pain as well as feeling of restless legs.  We discussed potential side effects and mechanism of action, patient is agreeable to trial and will follow-up in 2 months to assess efficacy.  If changes needed, consider pregabalin or Cymbalta.

## 2023-01-15 LAB — HM MAMMOGRAPHY

## 2023-01-18 ENCOUNTER — Encounter: Payer: Self-pay | Admitting: Family Medicine

## 2023-01-20 LAB — HEMOGLOBIN A1C: A1c: 5.4

## 2023-02-01 ENCOUNTER — Telehealth: Payer: Self-pay

## 2023-02-01 NOTE — Telephone Encounter (Signed)
-----   Message from Melida Quitter sent at 02/01/2023  8:41 AM EDT ----- Can we request mammo and pap records from [facility] please :)

## 2023-02-01 NOTE — Telephone Encounter (Signed)
Records have been requested.

## 2023-03-16 ENCOUNTER — Ambulatory Visit (INDEPENDENT_AMBULATORY_CARE_PROVIDER_SITE_OTHER): Payer: PRIVATE HEALTH INSURANCE | Admitting: Family Medicine

## 2023-03-16 ENCOUNTER — Encounter: Payer: Self-pay | Admitting: Family Medicine

## 2023-03-16 VITALS — BP 106/67 | HR 75 | Resp 18 | Ht 67.0 in | Wt 195.0 lb

## 2023-03-16 DIAGNOSIS — G2581 Restless legs syndrome: Secondary | ICD-10-CM | POA: Diagnosis not present

## 2023-03-16 DIAGNOSIS — G4701 Insomnia due to medical condition: Secondary | ICD-10-CM

## 2023-03-16 DIAGNOSIS — H9313 Tinnitus, bilateral: Secondary | ICD-10-CM | POA: Diagnosis not present

## 2023-03-16 MED ORDER — GABAPENTIN 300 MG PO CAPS
300.0000 mg | ORAL_CAPSULE | Freq: Every day | ORAL | 3 refills | Status: AC
Start: 2023-03-16 — End: ?

## 2023-03-16 NOTE — Patient Instructions (Addendum)
Magnesium glycinate  Continue with good ?le?? hygiene: ?Go to bed and get up at about the same time every day. ?Have coffee, tea, and other drinks or foods with ?affei?? only in the morning. ?Avoid eating close to bedtime - Try not to eat a large meal soon before you go to bed. It is better to eat a healthy and filling (but not too heavy) meal in the early evening. Try to avoid late-night snacking. ?Avoid ?l?ohol in the late afternoon, evening, and bedtime. ?Avoid smoking, especially in the evening. ?Keep your bedroom dark, cool, quiet, and free of reminders of work or other things that cause you stress. ?Try to solve problems before you go to bed. ?Get plenty of physical activity, but not right before bed - Exercising 4 to 6 hours before bedtime has the best impact on ?lee?Marland Kitchen ?Avoid looking at phones or reading devices ("e-books") that give off light before bed - This can make it harder to fall asleep. There is not good evidence that wearing special "blue light-filtering" glasses works to improve sleep. ?Keep the place where you sl?ep quiet and dark - If needed, you can use a white noise machine or ear plugs to block out sound. Blackout curtains or an eye mask can help block out light. ?Do not check the time at night - Try to keep alarm clocks, watches, or smartphones out of your line of sight. Checking the time in the middle of the night can make you feel more awake, and can make it hard to fall back asleep. ?Avoid long naps if you have trouble sleeping at night, especially in the late afternoon. Short naps (about 20 minutes) can be helpful, especially if your work schedule changes day to day and you need to be alert at different times.

## 2023-03-16 NOTE — Progress Notes (Signed)
Established Patient Office Visit  Subjective   Patient ID: [name] Orr, female    DOB: 03-28-1971  Age: 52 y.o. MRN: [id number]  Chief Complaint  Patient presents with   Insomnia    HPI [name] Orr is a 52 y.o. female presenting today for follow up of sleep. She struggles with sleep induction and sleep maintenance. She is taking gabapentin up to 400 mg nightly, denies side effects.  Gabapentin has only had minor improvement of restless leg syndrome, no improvement of insomnia.  Current sleep habits include brown noise machine, reading 20 minutes before bed, sleeping in a dark room.  She also endorses a return of tinnitus and associated headaches.  She has not been evaluated by ENT in the past.   Outpatient Medications Prior to Visit  Medication Sig   estradiol (VIVELLE-DOT) 0.05 MG/24HR patch Place 1 patch onto the skin 2 (two) times a week.   valACYclovir (VALTREX) 1000 MG tablet TAKE TWO TABLETS BY MOUTH TWICE DAILY (Patient taking differently: Take 1,000 mg by mouth as needed.)   WEGOVY 1 MG/0.5ML SOAJ Inject 1 mg into the skin once a week.   linaclotide (LINZESS) 145 MCG CAPS capsule Take 1 capsule (145 mcg total) by mouth daily before breakfast. (Patient not taking: Reported on 03/16/2023)   linaclotide (LINZESS) 290 MCG CAPS capsule Take 1 capsule (290 mcg total) by mouth daily before breakfast. (Patient not taking: Reported on 03/16/2023)   metoCLOPramide (REGLAN) 5 MG tablet Take 1 tablet 30-60 minutes prior to colonoscopy prep (Patient not taking: Reported on 03/16/2023)   [DISCONTINUED] gabapentin (NEURONTIN) 100 MG capsule Take 1 capsule (100 mg) by mouth at bedtime. May increase to up to 3 capsules (300 mg) at bedtime if needed. (Patient not taking: Reported on 03/16/2023)   No facility-administered medications prior to visit.    ROS Negative unless otherwise noted in HPI   Objective:     BP 106/67 (BP Location: Left Arm, Patient Position: Sitting,  Cuff Size: Normal)   Pulse 75   Resp 18   Ht 5\' 7"  (1.702 m)   Wt 195 lb (88.5 kg)   LMP 10/16/2010   SpO2 98%   BMI 30.54 kg/m   Physical Exam Constitutional:      General: She is not in acute distress.    Appearance: Normal appearance.  HENT:     Head: Normocephalic and atraumatic.  Pulmonary:     Effort: Pulmonary effort is normal. No respiratory distress.  Musculoskeletal:     Cervical back: Normal range of motion.  Neurological:     General: No focal deficit present.     Mental Status: She is alert and oriented to person, place, and time. Mental status is at baseline.  Psychiatric:        Mood and Affect: Mood normal.        Thought Content: Thought content normal.        Judgment: Judgment normal.     Assessment & Plan:  Insomnia due to medical condition -     Gabapentin; Take 1-3 capsules (300-900 mg total) by mouth at bedtime.  Dispense: 90 capsule; Refill: 3  Restless leg syndrome -     Gabapentin; Take 1-3 capsules (300-900 mg total) by mouth at bedtime.  Dispense: 90 capsule; Refill: 3  Tinnitus of both ears -     Ambulatory referral to ENT  Increased dose of gabapentin to improve RLS and improve insomnia.  If ineffective, recommend home sleep test or a  different medication, patient believes that she has tried Cymbalta in the past but does not remember any improvement.  Referral to ENT for evaluation of tinnitus.  Return in about 4 weeks (around 04/13/2023) for follow-up for sleep and RLS.    Melida Quitter, PA

## 2023-04-14 ENCOUNTER — Ambulatory Visit: Payer: PRIVATE HEALTH INSURANCE | Admitting: Family Medicine

## 2023-04-20 ENCOUNTER — Ambulatory Visit (INDEPENDENT_AMBULATORY_CARE_PROVIDER_SITE_OTHER): Payer: PRIVATE HEALTH INSURANCE | Admitting: Physician Assistant

## 2023-04-20 ENCOUNTER — Ambulatory Visit (INDEPENDENT_AMBULATORY_CARE_PROVIDER_SITE_OTHER): Payer: PRIVATE HEALTH INSURANCE | Admitting: Audiology

## 2023-04-20 DIAGNOSIS — Z0110 Encounter for examination of ears and hearing without abnormal findings: Secondary | ICD-10-CM | POA: Diagnosis not present

## 2023-04-20 DIAGNOSIS — H9312 Tinnitus, left ear: Secondary | ICD-10-CM | POA: Diagnosis not present

## 2023-04-20 NOTE — Progress Notes (Unsigned)
Dear Dr. Jairo Ben, Here is my assessment for our mutual patient, [name] Orr. Thank you for allowing me the opportunity to care for your patient. Please do not hesitate to contact me should you have any other questions. Sincerely, Burna Forts PA-C  Otolaryngology Clinic Note Referring provider: Dr. Jairo Ben HPI:  [name] Orr is a 52 y.o. female kindly referred by Dr. Jairo Ben for evaluation of tinnitus.   Left side only 3 week duration  PMR three years ago, started to have it around that time. Coming and going. Last time worst and longest.  Hearin los sduring hte bout, ringing roaring, hearing decreased while the event, sometimes goe sto bed with it and goes away when wakes up, normally about a week in duration, Nausea, some headache occasionally, never in the right ear. No loud exposuire or trauma. Was on prednisone fo rPMR for year and half. Didn't have ringing before    [place] noise helped.    H&N Surgery: *** Personal or FHx of bleeding dz or anesthesia difficulty: no ***  GLP-1: yes AP/AC: ***  Tobacco: ***. Alcohol: ***. Occupation: ***. Lives in *** with ***.  Independent Review of Additional Tests or Records:  ***   PMH/Meds/All/SocHx/FamHx/ROS:   Past Medical History:  Diagnosis Date   Actinic keratosis    Anemia    history   Anxiety    no meds   Blood in stool    bright red each time has BM for atleast 6 mths   Chronic back pain greater than 3 months duration    Depression    Endometriosis    Fainting spell    GERD (gastroesophageal reflux disease)    Headache(784.0)    History of chicken pox    Kidney stones    Migraine    Renal disorder    kidney stones     Past Surgical History:  Procedure Laterality Date   ABDOMINAL HYSTERECTOMY     partial; cervix still present   colonscopy     DIAGNOSTIC LAPAROSCOPY     x 2   DILATION AND CURETTAGE OF UTERUS     LUMBAR FUSION  [phone]   Dr Trey Sailors   PATELLA REALIGNMENT Left 2009    lateral realignment to lt knee   VARICOSE VEIN SURGERY Left     Family History  Problem Relation Age of Onset   Hyperlipidemia Mother    Hypertension Mother    COPD Mother    Alcohol abuse Father    Heart disease Father    Stroke Father    Kidney disease Father    Other Father        deceased from bowel blockage   COPD Maternal Grandfather    Colitis Neg Hx    Esophageal cancer Neg Hx    Stomach cancer Neg Hx    Rectal cancer Neg Hx      Social Connections: Not on file      Current Outpatient Medications:    estradiol (VIVELLE-DOT) 0.05 MG/24HR patch, Place 1 patch onto the skin 2 (two) times a week., Disp: , Rfl:    gabapentin (NEURONTIN) 300 MG capsule, Take 1-3 capsules (300-900 mg total) by mouth at bedtime., Disp: 90 capsule, Rfl: 3   valACYclovir (VALTREX) 1000 MG tablet, TAKE TWO TABLETS BY MOUTH TWICE DAILY (Patient taking differently: Take 1,000 mg by mouth as needed.), Disp: 12 tablet, Rfl: 0   WEGOVY 1 MG/0.5ML SOAJ, Inject 1 mg into the skin once a week., Disp: , Rfl:  Physical Exam:   LMP 10/16/2010   Pertinent Findings  CN II-XII intact *** Bilateral EAC clear and TM intact with well pneumatized middle ear spaces Weber 512: *** Rinne 512: AC > BC b/l *** Rine 1024: AC > BC b/l *** Anterior rhinoscopy: Septum ***; bilateral inferior turbinates with *** No lesions of oral cavity/oropharynx; dentition *** No obviously palpable neck masses/lymphadenopathy/thyromegaly No respiratory distress or stridor***  Seprately Identifiable Procedures:  None***  Impression & Plans:  [name] is a 52 y.o. female with ***   - f/u ***   Thank you for allowing me the opportunity to care for your patient. Please do not hesitate to contact me should you have any other questions.  Sincerely, Burna Forts PA-C [facility] Phone: [phone] Fax: [phone]  04/20/2023, 9:51 AM   MDM:  Level *** Complexity/Problems addressed:  *** Data complexity: *** independent review of *** - Morbidity: ***  - Prescription Drug prescribed or managed: ***

## 2023-04-21 ENCOUNTER — Encounter (INDEPENDENT_AMBULATORY_CARE_PROVIDER_SITE_OTHER): Payer: Self-pay | Admitting: Physician Assistant

## 2023-04-21 NOTE — Progress Notes (Signed)
Dear Dr. Jairo Ben, Here is my assessment for our mutual patient, [name] Orr. Thank you for allowing me the opportunity to care for your patient. Please do not hesitate to contact me should you have any other questions. Sincerely, Burna Forts PA-C  Otolaryngology Clinic Note Referring provider: Dr. Jairo Ben HPI:  [name] Orr is a 52 y.o. female kindly referred by Dr. Jairo Ben for evaluation of tinnitus.   The patient notes that approximately 3 years ago issues diagnosed with polymyalgia rheumatica.  Around the same time she also noticed she developed tinnitus in her left ear.  She notes that the symptoms are often loud, ringing and roaring, she has associated hearing when the symptoms are present, and they are only present in the left ear.  She notes that they usually last approximately 1 week but most recently she had a 3-week episode of the sensation.  She notes some associated nausea, some headache occasionally when they present.  She denies any other associated neurologic symptoms with the ear ringing.  She notes that it comes on very abruptly and goes off very abruptly as well.  She notes that a lot of times she will go to sleep and wake up with the symptoms gone.  She notes that has been very bothersome to her.  She denies any significant head trauma, no history of head or neck cancers.  She most recently has been using brown noise at night which seems to help the symptoms.  She has not had any significant workup for this previously.   H&N Surgery: No Personal or FHx of bleeding dz or anesthesia difficulty: no   GLP-1: yes AP/AC: no   Independent Review of Additional Tests or Records:  04/20/2023 Audiogram was independently reviewed and interpreted by me and it reveals Right ear: normal hearing [phone] hz; 100% word interpretation at 55 dB; type A tympanogram Left ear: Normal hearing [phone] hz; 100% word interpretation at 55 dB; type A tympanogram  Normal  hearing    PMH/Meds/All/SocHx/FamHx/ROS:   Past Medical History:  Diagnosis Date   Actinic keratosis    Anemia    history   Anxiety    no meds   Blood in stool    bright red each time has BM for atleast 6 mths   Chronic back pain greater than 3 months duration    Depression    Endometriosis    Fainting spell    GERD (gastroesophageal reflux disease)    Headache(784.0)    History of chicken pox    Kidney stones    Migraine    Renal disorder    kidney stones     Past Surgical History:  Procedure Laterality Date   ABDOMINAL HYSTERECTOMY     partial; cervix still present   colonscopy     DIAGNOSTIC LAPAROSCOPY     x 2   DILATION AND CURETTAGE OF UTERUS     LUMBAR FUSION  [phone]   Dr Trey Sailors   PATELLA REALIGNMENT Left 2009   lateral realignment to lt knee   VARICOSE VEIN SURGERY Left     Family History  Problem Relation Age of Onset   Hyperlipidemia Mother    Hypertension Mother    COPD Mother    Alcohol abuse Father    Heart disease Father    Stroke Father    Kidney disease Father    Other Father        deceased from bowel blockage   COPD Maternal Grandfather    Colitis Neg  Hx    Esophageal cancer Neg Hx    Stomach cancer Neg Hx    Rectal cancer Neg Hx      Social Connections: Not on file      Current Outpatient Medications:    estradiol (VIVELLE-DOT) 0.05 MG/24HR patch, Place 1 patch onto the skin 2 (two) times a week., Disp: , Rfl:    gabapentin (NEURONTIN) 300 MG capsule, Take 1-3 capsules (300-900 mg total) by mouth at bedtime., Disp: 90 capsule, Rfl: 3   valACYclovir (VALTREX) 1000 MG tablet, TAKE TWO TABLETS BY MOUTH TWICE DAILY (Patient taking differently: Take 1,000 mg by mouth as needed.), Disp: 12 tablet, Rfl: 0   WEGOVY 1 MG/0.5ML SOAJ, Inject 1 mg into the skin once a week., Disp: , Rfl:    Physical Exam:   LMP 10/16/2010   Pertinent Findings  CN II-XII intact Bilateral EAC clear and TM intact with well pneumatized middle  ear spaces Weber 512:  Rinne 512: AC > BC b/l  Anterior rhinoscopy: Septum midline; bilateral inferior turbinates with no hypertrophy No lesions of oral cavity/oropharynx; dentition  No obviously palpable neck masses/lymphadenopathy/thyromegaly No respiratory distress or stridor  Seprately Identifiable Procedures:  None  Impression & Plans:  [name] Orr is a 52 y.o. female with tinnitus  Tinnitus-   This is a 52 year old female present today with complaints of unilateral tinnitus.  At this time no clear etiology of the tinnitus, I did review her audiogram which showed no significant hearing loss.  She has no signs or symptoms of eustachian tube dysfunction, no hearing loss, no neurologic deficits, and no signs of vascular etiology.  Her symptoms come on abruptly and last anywhere from 1 to 3 weeks and leave abruptly.  Given the uncertain etiology at this time I do find it reasonable to obtain an MRI IAC to evaluate further.  Once the MRI is available for review would like to see the patient back in our office for repeat evaluation and ongoing management.    - f/u after MRI IAC   Thank you for allowing me the opportunity to care for your patient. Please do not hesitate to contact me should you have any other questions.  Sincerely, Burna Forts PA-C [facility] Phone: [phone] Fax: [phone]  04/21/2023, 1:22 PM

## 2023-04-21 NOTE — Progress Notes (Signed)
  [address] [place], [place] [address] [phone]  Audiological Evaluation    Name: [name] Orr     DOB:   [birth year]      MRN:   [id number]                                                                                     Service Date: 04/21/2023     Accompanied by: none   Patient comes today after Eyvonne Mechanic, PA-C sent a referral for a hearing evaluation due to concerns with tinnitus.   Symptoms Yes Details  Hearing loss  [x]  Perceives hearing changes with the left ear tinnitus.  Tinnitus  [x]  Left ear with onset 3-4 years ago and she was diagnosed with PMR. It comes and goes, but the last time it was consistent for 3 weeks. It is hard to describe her tinnitus but may be high pitched with an additional sound that may be roaring.   Ear pain/ Ear infections  []    Balance problems  []    Noise exposure  []    Previous ear surgeries  []    Family history  []    Amplification  []    Other  [x]  Used to be treated for migraines years ago.    Otoscopy: Right ear: Clear external ear canals and notable landmarks visualized on the tympanic membrane. Left ear:  Clear external ear canals and notable landmarks visualized on the tympanic membrane.  Tympanometry: Right ear: Type A- Normal external ear canal volume with normal middle ear pressure and tympanic membrane compliance. Left ear: Type A- Normal external ear canal volume with normal middle ear pressure and tympanic membrane compliance.  Of note, [facility]'s equipment not available at this time.   Pure tone Audiometry: Right ear- Normal hearing from [phone] Hz.  Left ear-  Normal hearing from [phone] Hz.    The hearing test results were completed under headphones and results are deemed to be of good reliability. Test technique:  conventional     Speech Audiometry: Right ear- Speech Reception Threshold (SRT) was obtained at 10 dBHL Left ear-Speech Reception Threshold (SRT) was obtained at 10 dBHL    Word Recognition Score Tested using NU-6 (MLV) Right ear: 100% was obtained at a presentation level of 55 dBHL with contralateral masking which is deemed as  excellent. Left ear: 100% was obtained at a presentation level of 55 dBHL with contralateral masking which is deemed as  excellent.    Impression: There is not a significant difference in pure-tone thresholds between ears. There is not a significant difference in the word recognition score in between ears.    Recommendations: Follow up with ENT as scheduled for today. Return for a hearing evaluation if concerns with hearing changes arise or per MD recommendation. Use hearing protection when exposed to loud/damaging sounds.  Consider various tinnitus strategies, including the use of a sound generator, hearing aids, and/or tinnitus retraining therapy.    Mikinzie Maciejewski MARIE LEROUX-MARTINEZ, AUD

## 2023-07-01 ENCOUNTER — Encounter: Payer: Self-pay | Admitting: Family Medicine

## 2023-12-07 ENCOUNTER — Ambulatory Visit: Payer: PRIVATE HEALTH INSURANCE | Admitting: Dermatology

## 2023-12-07 DIAGNOSIS — Z1283 Encounter for screening for malignant neoplasm of skin: Secondary | ICD-10-CM | POA: Diagnosis not present

## 2023-12-07 DIAGNOSIS — Z7189 Other specified counseling: Secondary | ICD-10-CM

## 2023-12-07 DIAGNOSIS — D229 Melanocytic nevi, unspecified: Secondary | ICD-10-CM

## 2023-12-07 DIAGNOSIS — W908XXA Exposure to other nonionizing radiation, initial encounter: Secondary | ICD-10-CM

## 2023-12-07 DIAGNOSIS — Z5111 Encounter for antineoplastic chemotherapy: Secondary | ICD-10-CM

## 2023-12-07 DIAGNOSIS — Z79899 Other long term (current) drug therapy: Secondary | ICD-10-CM

## 2023-12-07 DIAGNOSIS — L570 Actinic keratosis: Secondary | ICD-10-CM | POA: Diagnosis not present

## 2023-12-07 DIAGNOSIS — L814 Other melanin hyperpigmentation: Secondary | ICD-10-CM

## 2023-12-07 DIAGNOSIS — L820 Inflamed seborrheic keratosis: Secondary | ICD-10-CM

## 2023-12-07 DIAGNOSIS — D1801 Hemangioma of skin and subcutaneous tissue: Secondary | ICD-10-CM

## 2023-12-07 DIAGNOSIS — Z872 Personal history of diseases of the skin and subcutaneous tissue: Secondary | ICD-10-CM

## 2023-12-07 DIAGNOSIS — L578 Other skin changes due to chronic exposure to nonionizing radiation: Secondary | ICD-10-CM | POA: Diagnosis not present

## 2023-12-07 DIAGNOSIS — L821 Other seborrheic keratosis: Secondary | ICD-10-CM

## 2023-12-07 DIAGNOSIS — Z8619 Personal history of other infectious and parasitic diseases: Secondary | ICD-10-CM

## 2023-12-07 NOTE — Patient Instructions (Addendum)
 Can start cream right away or wait 1 month    - Start 5-fluorouracil/calcipotriene cream twice a day for 7 days to affected areas including top of chest and spot treat area forehead . Prescription sent to [facility]. Patient advised they will receive an email to purchase the medication online and have it sent to their home. Patient provided with handout reviewing treatment course and side effects and advised to call or message us  on MyChart with any concerns.  Reviewed course of treatment and expected reaction.  Patient advised to expect inflammation and crusting and advised that erosions are possible.  Patient advised to be diligent with sun protection during and after treatment. Counseled to keep medication out of reach of children and pets.   Instructions for [facility]  One or more of your medications was sent to the [facility] order [facility]. You will receive an email from them and can purchase the medicine through that link. It will then be mailed to your home at the address you confirmed. If for any reason you do not receive an email from them, please check your spam folder. If you still do not find the email, please let us  know. [facility] phone number is [phone].     5-Fluorouracil/Calcipotriene Patient Education   Actinic keratoses are the dry, red scaly spots on the skin caused by sun damage. A portion of these spots can turn into skin cancer with time, and treating them can help prevent development of skin cancer.   Treatment of these spots requires removal of the defective skin cells. There are various ways to remove actinic keratoses, including freezing with liquid nitrogen, treatment with creams, or treatment with a blue light procedure in the office.   5-fluorouracil cream is a topical cream used to treat actinic keratoses. It works by interfering with the growth of abnormal fast-growing skin cells, such  as actinic keratoses. These cells peel off and are replaced by healthy ones.   5-fluorouracil/calcipotriene is a combination of the 5-fluorouracil cream with a vitamin D  analog cream called calcipotriene. The calcipotriene alone does not treat actinic keratoses. However, when it is combined with 5-fluorouracil, it helps the 5-fluorouracil treat the actinic keratoses much faster so that the same results can be achieved with a much shorter treatment time.  INSTRUCTIONS FOR 5-FLUOROURACIL/CALCIPOTRIENE CREAM:   5-fluorouracil/calcipotriene cream typically only needs to be used for 4-7 days. A thin layer should be applied twice a day to the treatment areas recommended by your physician.   If your physician prescribed you separate tubes of 5-fluourouracil and calcipotriene, apply a thin layer of 5-fluorouracil followed by a thin layer of calcipotriene.   Avoid contact with your eyes, nostrils, and mouth. Do not use 5-fluorouracil/calcipotriene cream on infected or open wounds.   You will develop redness, irritation and some crusting at areas where you have pre-cancer damage/actinic keratoses. IF YOU DEVELOP PAIN, BLEEDING, OR SIGNIFICANT CRUSTING, STOP THE TREATMENT EARLY - you have already gotten a good response and the actinic keratoses should clear up well.  Wash your hands after applying 5-fluorouracil 5% cream on your skin.   A moisturizer or sunscreen with a minimum SPF 30 should be applied each morning.   Once you have finished the treatment, you can apply a thin layer of Vaseline twice a day to irritated areas to soothe and calm the areas more quickly. If you experience significant discomfort, contact your physician.  For some patients it is necessary to repeat  the treatment for best results.  SIDE EFFECTS: When using 5-fluorouracil/calcipotriene cream, you may have mild irritation, such as redness, dryness, swelling, or a mild burning sensation. This usually resolves within 2 weeks. The  more actinic keratoses you have, the more redness and inflammation you can expect during treatment. Eye irritation has been reported rarely. If this occurs, please let us  know.  If you have any trouble using this cream, please call the office. If you have any other questions about this information, please do not hesitate to ask me before you leave the office.     Seborrheic Keratosis  What causes seborrheic keratoses? Seborrheic keratoses are harmless, common skin growths that first appear during adult life.  As time goes by, more growths appear.  Some people may develop a large number of them.  Seborrheic keratoses appear on both covered and uncovered body parts.  They are not caused by sunlight.  The tendency to develop seborrheic keratoses can be inherited.  They vary in color from skin-colored to gray, brown, or even black.  They can be either smooth or have a rough, warty surface.   Seborrheic keratoses are superficial and look as if they were stuck on the skin.  Under the microscope this type of keratosis looks like layers upon layers of skin.  That is why at times the top layer may seem to fall off, but the rest of the growth remains and re-grows.    Treatment Seborrheic keratoses do not need to be treated, but can easily be removed in the office.  Seborrheic keratoses often cause symptoms when they rub on clothing or jewelry.  Lesions can be in the way of shaving.  If they become inflamed, they can cause itching, soreness, or burning.  Removal of a seborrheic keratosis can be accomplished by freezing, burning, or surgery. If any spot bleeds, scabs, or grows rapidly, please return to have it checked, as these can be an indication of a skin cancer.   Cryotherapy Aftercare  Wash gently with soap and water everyday.   Apply Vaseline and Band-Aid daily until healed.   Actinic keratoses are precancerous spots that appear secondary to cumulative UV radiation exposure/sun exposure over time.  They are chronic with expected duration over 1 year. A portion of actinic keratoses will progress to squamous cell carcinoma of the skin. It is not possible to reliably predict which spots will progress to skin cancer and so treatment is recommended to prevent development of skin cancer.  Recommend daily broad spectrum sunscreen SPF 30+ to sun-exposed areas, reapply every 2 hours as needed.  Recommend staying in the shade or wearing long sleeves, sun glasses (UVA+UVB protection) and wide brim hats (4-inch brim around the entire circumference of the hat). Call for new or changing lesions.      Gentle Skin Care Guide  1. Bathe no more than once a day.  2. Avoid bathing in hot water  3. Use a mild soap like Dove, Vanicream, Cetaphil, CeraVe. Can use Lever 2000 or Cetaphil antibacterial soap  4. Use soap only where you need it. On most days, use it under your arms, between your legs, and on your feet. Let the water rinse other areas unless visibly dirty.  5. When you get out of the bath/shower, use a towel to gently blot your skin dry, don't rub it.  6. While your skin is still a little damp, apply a moisturizing cream such as Vanicream, CeraVe, Cetaphil, Eucerin, Sarna lotion or plain Vaseline Jelly.  For hands apply Neutrogena [place] Hand Cream or Excipial Hand Cream.  7. Reapply moisturizer any time you start to itch or feel dry.  8. Sometimes using free and clear laundry detergents can be helpful. Fabric softener sheets should be avoided. Downy Free & Gentle liquid, or any liquid fabric softener that is free of dyes and perfumes, it acceptable to use  9. If your doctor has given you prescription creams you may apply moisturizers over them   Melanoma ABCDEs  Melanoma is the most dangerous type of skin cancer, and is the leading cause of death from skin disease.  You are more likely to develop melanoma if you: Have light-colored skin, light-colored eyes, or red or blond hair Spend a  lot of time in the sun Tan regularly, either outdoors or in a tanning bed Have had blistering sunburns, especially during childhood Have a close family member who has had a melanoma Have atypical moles or large birthmarks  Early detection of melanoma is key since treatment is typically straightforward and cure rates are extremely high if we catch it early.   The first sign of melanoma is often a change in a mole or a new dark spot.  The ABCDE system is a way of remembering the signs of melanoma.  A for asymmetry:  The two halves do not match. B for border:  The edges of the growth are irregular. C for color:  A mixture of colors are present instead of an even brown color. D for diameter:  Melanomas are usually (but not always) greater than 6mm - the size of a pencil eraser. E for evolution:  The spot keeps changing in size, shape, and color.  Please check your skin once per month between visits. You can use a small mirror in front and a large mirror behind you to keep an eye on the back side or your body.   If you see any new or changing lesions before your next follow-up, please call to schedule a visit.  Please continue daily skin protection including broad spectrum sunscreen SPF 30+ to sun-exposed areas, reapplying every 2 hours as needed when you're outdoors.   Staying in the shade or wearing long sleeves, sun glasses (UVA+UVB protection) and wide brim hats (4-inch brim around the entire circumference of the hat) are also recommended for sun protection.      Due to recent changes in healthcare laws, you may see results of your pathology and/or laboratory studies on MyChart before the doctors have had a chance to review them. We understand that in some cases there may be results that are confusing or concerning to you. Please understand that not all results are received at the same time and often the doctors may need to interpret multiple results in order to provide you with the best  plan of care or course of treatment. Therefore, we ask that you please give us  2 business days to thoroughly review all your results before contacting the office for clarification. Should we see a critical lab result, you will be contacted sooner.   If You Need Anything After Your Visit  If you have any questions or concerns for your doctor, please call our main line at [phone] and press option 4 to reach your doctor's medical assistant. If no one answers, please leave a voicemail as directed and we will return your call as soon as possible. Messages left after 4 pm will be answered the following business day.   You may also  send us  a message via MyChart. We typically respond to MyChart messages within 1-2 business days.  For prescription refills, please ask your pharmacy to contact our office. Our fax number is [phone].  If you have an urgent issue when the clinic is closed that cannot wait until the next business day, you can page your doctor at the number below.    Please note that while we do our best to be available for urgent issues outside of office hours, we are not available 24/7.   If you have an urgent issue and are unable to reach us , you may choose to seek medical care at your doctor's office, retail clinic, urgent care center, or emergency room.  If you have a medical emergency, please immediately call 911 or go to the emergency department.  Pager Numbers  - Dr. Hester: [phone]  - Dr. Jackquline: [phone]  - Dr. Claudene: [phone]   In the event of inclement weather, please call our main line at [phone] for an update on the status of any delays or closures.  Dermatology Medication Tips: Please keep the boxes that topical medications come in in order to help keep track of the instructions about where and how to use these. Pharmacies typically print the medication instructions only on the boxes and not directly on the medication tubes.   If your  medication is too expensive, please contact our office at [phone] option 4 or send us  a message through MyChart.   We are unable to tell what your co-pay for medications will be in advance as this is different depending on your insurance coverage. However, we may be able to find a substitute medication at lower cost or fill out paperwork to get insurance to cover a needed medication.   If a prior authorization is required to get your medication covered by your insurance company, please allow us  1-2 business days to complete this process.  Drug prices often vary depending on where the prescription is filled and some pharmacies may offer cheaper prices.  The website www.goodrx.com contains coupons for medications through different pharmacies. The prices here do not account for what the cost may be with help from insurance (it may be cheaper with your insurance), but the website can give you the price if you did not use any insurance.  - You can print the associated coupon and take it with your prescription to the pharmacy.  - You may also stop by our office during regular business hours and pick up a GoodRx coupon card.  - If you need your prescription sent electronically to a different pharmacy, notify our office through [facility] or by phone at [phone] option 4.     Si Usted Necesita Algo Despus de Su Visita  Tambin puede enviarnos un mensaje a travs de Clinical cytogeneticist. Por lo general respondemos a los mensajes de MyChart en el transcurso de 1 a 2 das hbiles.  Para renovar recetas, por favor pida a su farmacia que se ponga en contacto con nuestra oficina. Randi lakes de fax es [place] [phone].  Si tiene un asunto urgente cuando la clnica est cerrada y que no puede esperar hasta el siguiente da hbil, puede llamar/localizar a su doctor(a) al nmero que aparece a continuacin.   Por favor, tenga en cuenta que aunque hacemos todo lo posible para estar disponibles para  asuntos urgentes fuera del horario de [place], no estamos disponibles las 24 horas del da, los 7 [address]  [address] de la [place].   Si  tiene un problema urgente y no puede comunicarse con nosotros, puede optar por buscar atencin mdica  en el consultorio de su doctor(a), en una clnica privada, en un centro de atencin urgente o en una sala de emergencias.  Si tiene Engineer, drilling, por favor llame inmediatamente al 911 o vaya a la sala de emergencias.  Nmeros de bper  - Dr. Hester: [phone]  - Dra. Jackquline: [phone]  - Dr. Claudene: [phone]   En caso de inclemencias del tiempo, por favor llame a landry capes principal al [phone] para una actualizacin sobre el [place] de cualquier retraso o cierre.  Consejos para la medicacin en dermatologa: Por favor, guarde las cajas en las que vienen los medicamentos de uso tpico para ayudarle a seguir las instrucciones sobre dnde y cmo usarlos. Las farmacias generalmente imprimen las instrucciones del medicamento slo en las cajas y no directamente en los tubos del [place].   Si su medicamento es muy caro, por favor, pngase en contacto con landry rieger llamando al [phone] y presione la opcin 4 o envenos un mensaje a travs de Clinical cytogeneticist.   No podemos decirle cul ser su copago por los medicamentos por adelantado ya que esto es diferente dependiendo de la cobertura de su seguro. Sin embargo, es posible que podamos encontrar un medicamento sustituto a Audiological scientist un formulario para que el seguro cubra el medicamento que se considera necesario.   Si se requiere una autorizacin previa para que su compaa de seguros malta su medicamento, por favor permtanos de 1 a 2 das hbiles para completar este proceso.  Los precios de los medicamentos varan con frecuencia dependiendo del Environmental consultant de dnde se surte la receta y alguna farmacias pueden ofrecer precios ms baratos.  El sitio web www.goodrx.com tiene cupones para  medicamentos de Health and safety inspector. Los precios aqu no tienen en cuenta lo que podra costar con la ayuda del seguro (puede ser ms barato con su seguro), pero el sitio web puede darle el precio si no utiliz Tourist information centre manager.  - Puede imprimir el cupn correspondiente y llevarlo con su receta a la farmacia.  - Tambin puede pasar por nuestra oficina durante el horario de atencin regular y Education officer, museum una tarjeta de cupones de GoodRx.  - Si necesita que su receta se enve electrnicamente a una farmacia diferente, informe a nuestra oficina a travs de MyChart de [place] o por telfono llamando al [phone] y presione la opcin 4.

## 2023-12-07 NOTE — Progress Notes (Unsigned)
 Follow-Up Visit   Subjective  [name] Orr is a 53 y.o. female who presents for the following: Skin Cancer Screening and Full Body Skin Exam Hx of aks and isks, hx of fever blister, reports currently getting refills of acyclovir  from primary doctor.   The patient presents for Total-Body Skin Exam (TBSE) for skin cancer screening and mole check. The patient has spots, moles and lesions to be evaluated, some may be new or changing and the patient may have concern these could be cancer.  The following portions of the chart were reviewed this encounter and updated as appropriate: medications, allergies, medical history  Review of Systems:  No other skin or systemic complaints except as noted in HPI or Assessment and Plan.  Objective  Well appearing patient in no apparent distress; mood and affect are within normal limits.  A full examination was performed including scalp, head, eyes, ears, nose, lips, neck, chest, axillae, abdomen, back, buttocks, bilateral upper extremities, bilateral lower extremities, hands, feet, fingers, toes, fingernails, and toenails. All findings within normal limits unless otherwise noted below.   Relevant physical exam findings are noted in the Assessment and Plan.     left temple x 1 Erythematous stuck-on, waxy papule or plaque left forehead x 2, above left brow x 1, left chest x 2 (5) Erythematous thin papules/macules with gritty scale.   Assessment & Plan   SKIN CANCER SCREENING PERFORMED TODAY.  ACTINIC DAMAGE - Chronic condition, secondary to cumulative UV/sun exposure - diffuse scaly erythematous macules with underlying dyspigmentation - Recommend daily broad spectrum sunscreen SPF 30+ to sun-exposed areas, reapply every 2 hours as needed.  - Staying in the shade or wearing long sleeves, sun glasses (UVA+UVB protection) and wide brim hats (4-inch brim around the entire circumference of the hat) are also recommended for sun protection.  -  Call for new or changing lesions.  LENTIGINES, SEBORRHEIC KERATOSES, HEMANGIOMAS - Benign normal skin lesions - Benign-appearing - Call for any changes  MELANOCYTIC NEVI - Tan-brown and/or pink-flesh-colored symmetric macules and papules - Benign appearing on exam today - Observation - Call clinic for new or changing moles - Recommend daily use of broad spectrum spf 30+ sunscreen to sun-exposed areas.    Congenital non-neoplastic nevus R abdomen Exam: brown plaque see new photo benign-appearing.  Observation.  Call clinic for new or changing lesions.  Recommend daily use of broad spectrum spf 30+ sunscreen to sun-exposed areas.    Herpes simplex Lips Clear today  Discussed Sitivig - will send in Herpes Simplex Virus = Cold Sores = Fever Blisters is a chronic recurring blistering; scabbing sore-producing viral infection that is recurrent usually in the same area triggered by stress, sun/UV exposure and trauma.  It is infectious and can be spread from person to person by direct contact.  It is not curable, but is treatable with topical and oral medication. Currently getting refills through primary doctor Acyclovir  (SITAVIG ) 50 MG TABS - Lips Apply one tab to gum line at site of fever blister and let dissolve.   INFLAMED SEBORRHEIC KERATOSIS left temple x 1 Symptomatic, irritating, patient would like treated. Destruction of lesion - left temple x 1 Complexity: simple   Destruction method: cryotherapy   Informed consent: discussed and consent obtained   Timeout:  patient name, date of birth, surgical site, and procedure verified Lesion destroyed using liquid nitrogen: Yes   Region frozen until ice ball extended beyond lesion: Yes   Outcome: patient tolerated procedure well with no complications  Post-procedure details: wound care instructions given    ACTINIC KERATOSIS (5) left forehead x 2, above left brow x 1, left chest x 2 (5)  Patient advised can start cream right away  or start in 1 month  - Start 5-fluorouracil/calcipotriene cream twice a day for 7 days to affected areas including on top of chest also discussed can spot treat areas at forehead. Prescription sent to [facility]. Patient advised they will receive an email to purchase the medication online and have it sent to their home. Patient provided with handout reviewing treatment course and side effects and advised to call or message us  on MyChart with any concerns.  Reviewed course of treatment and expected reaction.  Patient advised to expect inflammation and crusting and advised that erosions are possible.  Patient advised to be diligent with sun protection during and after treatment. Counseled to keep medication out of reach of children and pets.  ACTINIC DAMAGE WITH PRECANCEROUS ACTINIC KERATOSES Counseling for Topical Chemotherapy Management: Patient exhibits: - Severe, confluent actinic changes with pre-cancerous actinic keratoses that is secondary to cumulative UV radiation exposure over time - Condition that is severe; chronic, not at goal. - diffuse scaly erythematous macules and papules with underlying dyspigmentation - Discussed Prescription Field Treatment topical Chemotherapy for Severe, Chronic Confluent Actinic Changes with Pre-Cancerous Actinic Keratoses Field treatment involves treatment of an entire area of skin that has confluent Actinic Changes (Sun/ Ultraviolet light damage) and PreCancerous Actinic Keratoses by method of PhotoDynamic Therapy (PDT) and/or prescription Topical Chemotherapy agents such as 5-fluorouracil, 5-fluorouracil/calcipotriene, and/or imiquimod.  The purpose is to decrease the number of clinically evident and subclinical PreCancerous lesions to prevent progression to development of skin cancer by chemically destroying early precancer changes that may or may not be visible.  It has been shown to reduce the risk of developing skin cancer in the  treated area. As a result of treatment, redness, scaling, crusting, and open sores may occur during treatment course. One or more than one of these methods may be used and may have to be used several times to control, suppress and eliminate the PreCancerous changes. Discussed treatment course, expected reaction, and possible side effects. - Recommend daily broad spectrum sunscreen SPF 30+ to sun-exposed areas, reapply every 2 hours as needed.  - Staying in the shade or wearing long sleeves, sun glasses (UVA+UVB protection) and wide brim hats (4-inch brim around the entire circumference of the hat) are also recommended. - Call for new or changing lesions.  Actinic keratoses are precancerous spots that appear secondary to cumulative UV radiation exposure/sun exposure over time. They are chronic with expected duration over 1 year. A portion of actinic keratoses will progress to squamous cell carcinoma of the skin. It is not possible to reliably predict which spots will progress to skin cancer and so treatment is recommended to prevent development of skin cancer.  Recommend daily broad spectrum sunscreen SPF 30+ to sun-exposed areas, reapply every 2 hours as needed.  Recommend staying in the shade or wearing long sleeves, sun glasses (UVA+UVB protection) and wide brim hats (4-inch brim around the entire circumference of the hat). Call for new or changing lesions. Destruction of lesion - left forehead x 2, above left brow x 1, left chest x 2 (5) Complexity: simple   Destruction method: cryotherapy   Informed consent: discussed and consent obtained   Timeout:  patient name, date of birth, surgical site, and procedure verified Lesion destroyed using liquid nitrogen: Yes  Region frozen until ice ball extended beyond lesion: Yes   Outcome: patient tolerated procedure well with no complications   Post-procedure details: wound care instructions given    ACTINIC SKIN DAMAGE   SKIN CANCER  SCREENING   LENTIGO   MELANOCYTIC NEVUS, UNSPECIFIED LOCATION   HISTORY OF COLD SORES   Return for 6 month ak followup on chest and forehead , 1 year tbse .  IEleanor Blush, CMA, am acting as scribe for Alm Rhyme, MD.   Documentation: I have reviewed the above documentation for accuracy and completeness, and I agree with the above.  Alm Rhyme, MD

## 2023-12-07 NOTE — Progress Notes (Deleted)
   Follow-Up Visit   Subjective  [name] Orr is a 53 y.o. female who presents for the following: Skin Cancer Screening and Full Body Skin Exam Patient reports a spot on her right shoulder Dry spots chest, shoulders on left legs   The patient presents for Total-Body Skin Exam (TBSE) for skin cancer screening and mole check. The patient has spots, moles and lesions to be evaluated, some may be new or changing and the patient may have concern these could be cancer.    The following portions of the chart were reviewed this encounter and updated as appropriate: medications, allergies, medical history  Review of Systems:  No other skin or systemic complaints except as noted in HPI or Assessment and Plan.  Objective  Well appearing patient in no apparent distress; mood and affect are within normal limits.  A full examination was performed including scalp, head, eyes, ears, nose, lips, neck, chest, axillae, abdomen, back, buttocks, bilateral upper extremities, bilateral lower extremities, hands, feet, fingers, toes, fingernails, and toenails. All findings within normal limits unless otherwise noted below.   Relevant physical exam findings are noted in the Assessment and Plan.    Assessment & Plan   SKIN CANCER SCREENING PERFORMED TODAY.  ACTINIC DAMAGE - Chronic condition, secondary to cumulative UV/sun exposure - diffuse scaly erythematous macules with underlying dyspigmentation - Recommend daily broad spectrum sunscreen SPF 30+ to sun-exposed areas, reapply every 2 hours as needed.  - Staying in the shade or wearing long sleeves, sun glasses (UVA+UVB protection) and wide brim hats (4-inch brim around the entire circumference of the hat) are also recommended for sun protection.  - Call for new or changing lesions.  LENTIGINES, SEBORRHEIC KERATOSES, HEMANGIOMAS - Benign normal skin lesions - Benign-appearing - Call for any changes  MELANOCYTIC NEVI - Tan-brown and/or  pink-flesh-colored symmetric macules and papules - Benign appearing on exam today - Observation - Call clinic for new or changing moles - Recommend daily use of broad spectrum spf 30+ sunscreen to sun-exposed areas.     No follow-ups on file.  IFay Kirks, CMA, am acting as scribe for Alm Rhyme, MD .   Documentation: I have reviewed the above documentation for accuracy and completeness, and I agree with the above.  Alm Rhyme, MD

## 2023-12-09 ENCOUNTER — Encounter: Payer: Self-pay | Admitting: Dermatology

## 2024-06-05 ENCOUNTER — Ambulatory Visit: Payer: PRIVATE HEALTH INSURANCE | Admitting: Dermatology

## 2024-06-12 ENCOUNTER — Ambulatory Visit: Payer: PRIVATE HEALTH INSURANCE

## 2024-12-11 ENCOUNTER — Ambulatory Visit: Payer: PRIVATE HEALTH INSURANCE | Admitting: Dermatology
